# Patient Record
Sex: Female | Born: 1965 | Race: White | Hispanic: No | Marital: Married | Smoking: Never smoker
Health system: Southern US, Community
[De-identification: ages and names within clinical notes are randomized; demographics above are authoritative.]

## PROBLEM LIST (undated history)

## (undated) DIAGNOSIS — C9210 Chronic myeloid leukemia, BCR/ABL-positive, not having achieved remission: Secondary | ICD-10-CM

## (undated) DIAGNOSIS — N83209 Unspecified ovarian cyst, unspecified side: Secondary | ICD-10-CM

## (undated) HISTORY — DX: Chronic myeloid leukemia, BCR/ABL-positive, not having achieved remission: C92.10

## (undated) HISTORY — PX: BUNIONECTOMY: SHX129

---

## 2000-09-05 ENCOUNTER — Other Ambulatory Visit: Admission: RE | Admit: 2000-09-05 | Discharge: 2000-09-05 | Payer: Self-pay | Admitting: *Deleted

## 2001-01-11 ENCOUNTER — Inpatient Hospital Stay (HOSPITAL_COMMUNITY): Admission: RE | Admit: 2001-01-11 | Discharge: 2001-01-11 | Payer: Self-pay | Admitting: Obstetrics & Gynecology

## 2001-02-28 ENCOUNTER — Encounter: Payer: Self-pay | Admitting: Gynecology

## 2001-02-28 ENCOUNTER — Ambulatory Visit (HOSPITAL_COMMUNITY): Admission: RE | Admit: 2001-02-28 | Discharge: 2001-02-28 | Payer: Self-pay | Admitting: Gynecology

## 2001-03-27 ENCOUNTER — Ambulatory Visit (HOSPITAL_COMMUNITY): Admission: RE | Admit: 2001-03-27 | Discharge: 2001-03-27 | Payer: Self-pay | Admitting: Gynecology

## 2001-03-27 ENCOUNTER — Encounter: Payer: Self-pay | Admitting: Gynecology

## 2001-11-05 HISTORY — PX: LAPAROSCOPIC ENDOMETRIOSIS FULGURATION: SUR769

## 2002-06-05 ENCOUNTER — Encounter (INDEPENDENT_AMBULATORY_CARE_PROVIDER_SITE_OTHER): Payer: Self-pay | Admitting: *Deleted

## 2002-06-05 ENCOUNTER — Ambulatory Visit (HOSPITAL_COMMUNITY): Admission: RE | Admit: 2002-06-05 | Discharge: 2002-06-05 | Payer: Self-pay | Admitting: Gynecology

## 2003-09-23 ENCOUNTER — Other Ambulatory Visit: Admission: RE | Admit: 2003-09-23 | Discharge: 2003-09-23 | Payer: Self-pay | Admitting: Gynecology

## 2003-11-10 ENCOUNTER — Encounter: Admission: RE | Admit: 2003-11-10 | Discharge: 2003-11-10 | Payer: Self-pay | Admitting: Gynecology

## 2010-01-18 ENCOUNTER — Other Ambulatory Visit: Admission: RE | Admit: 2010-01-18 | Discharge: 2010-01-18 | Payer: Self-pay | Admitting: Family Medicine

## 2010-01-18 ENCOUNTER — Encounter: Admission: RE | Admit: 2010-01-18 | Discharge: 2010-01-18 | Payer: Self-pay | Admitting: Family Medicine

## 2011-03-23 NOTE — Op Note (Signed)
NAME:  Christina Kelly, Christina Kelly                       ACCOUNT NO.:  [id number]   MEDICAL RECORD NO.:  [id number]                   PATIENT TYPE:  AMB   LOCATION:  SDC                                  FACILITY:  [facility]   PHYSICIAN:  Ivor Costa. Farrel Gobble, M.D.              DATE OF BIRTH:  11/03/1966   DATE OF PROCEDURE:  06/05/2002  DATE OF DISCHARGE:                                 OPERATIVE REPORT   PREOPERATIVE DIAGNOSIS:  Severe dysmenorrhea.   POSTOPERATIVE DIAGNOSIS:  1. Severe dysmenorrhea.  2. Endometriosis.   PROCEDURE:  Laparoscopic fulguration of endometriosis with the YAG and  electrocautery and sharp excision of endometriosis.   SURGEON:  Ivor Costa. Farrel Gobble, M.D.   ANESTHESIA:  General.   ESTIMATED BLOOD LOSS:  Minimal.   FINDINGS:  The left ovary was noted to have multiple areas of serosal  studding of endometriosis.  There was also what appeared to be black and  white lesions on the right sidewall, right uterosacral ligament, and  anterior cul-de-sac.  The right adnexa was remarkable only for a functional  cyst.  Pathology was of peritoneum from the left sidewall..   DESCRIPTION OF PROCEDURE:  The patient was taken to the operating room, and  general anesthesia was induced.  Placed in dorsal lithotomy position and  prepped and draped in the usual sterile fashion.  Bivalve speculum was  placed in the vagina, and the cervix was visualized and Hulka manipulator  was placed.  Attention was then turned to the abdomen.  An infraumbilical  incision was made with a scalpel through which the Veress needle was  inserted.  Opening pressure was 2, and pneumoperitoneum was created.  __________ appreciated of the liver.  The #10-11 disposable trocar was then  inserted through the infraumbilical port.  Placement in the abdominal cavity  was confirmed.  The uterus was elevated, and the findings were noted.  The  __________ on torso, we had to make a 5 mm strip for pubic port which we did  under direct visualization in order to have better access to the pelvis.  Because of multiple areas of studding of endometriosis, we decided to use  the YAG laser #4 round tip at 10 watts were transmitted in infraumbilical  port, and the uterosacral ligaments and left ovary were spot treated.  Also,  a small area on the anterior cul-de-sac was spot treated.  However, prior to  treating the left sidewall, we wanted to see peristalsis of the left ureter.  We had seen peristalsis of the right.  However, despite multiple attempts at  promoting peristalsis, none was able to be seen.  Therefore, did not feel  comfortable lasering the multiple sites on the left sidewall.  Instead, the  YAG was removed.  The suction irrigator was placed in a __________  spontaneously __________ in the ovary, and the retroperitoneal space was  hydrodissected.  The peritoneum was hydrodissected off the  retroperitoneum  from the retroperitoneal space.  The peritoneum was then elevated, and the  areas of concern were sharply excised off.  Hemostasis was noted, but there  was another area of endometriosis on the ovary that was seen, and this was  treated with cautery with the Kleppinger off another area of endometriosis  seen and peritoneum that had not been incorporated into the biopsy was  treated with cautery after the underlying structures were bluntly dissected  off.  The pelvis was irrigated with copious amounts of warm saline.  Inspection of the biopsied areas were  hemostatic.  The instruments were then removed under direct visualization.  The infraumbilical port was closed with figure-of-eight of 0 Vicryl .  Subcu  was closed with plain.  All three ports were injected with 0.25% Marcaine  for a total of 10 cc.  The instruments were removed from the vagina, and the  cervix was noted to be hemostatic.                                               Ivor Costa. Farrel Gobble, M.D.    THL/MEDQ  D:  06/05/2002  T:   06/10/2002  Job:  [id number]

## 2011-03-23 NOTE — Op Note (Signed)
NAME:  Christina Kelly, VANALSTINE NO.:  [id number]   MEDICAL RECORD NO.:  [id number]                   PATIENT TYPE:   LOCATION:                                       FACILITY:   PHYSICIAN:  Ivor Costa. Farrel Gobble, M.D.              DATE OF BIRTH:   DATE OF PROCEDURE:  DATE OF DISCHARGE:                                 OPERATIVE REPORT   CHIEF COMPLAINT:  Severe dysmenorrhea.   HISTORY OF PRESENT ILLNESS:  The patient is a 45 year old, G0 who reports  history of  regular menstrual cycles who states that she has severe  dysmenorrhea.  The patient usually takes 3 Anaprox DS multiple times a day  during the first several days of her cycle which minimizes the pain but does  not completely eliminate it.  She as unaware initially that the  pain was  out of proportion to that of the average person.  The patient also reported  later on that there indeed was a family history of endometriosis having a  diagnosis in her sister and also being found in a maternal aunt.  Her sister  is currently scheduled to undergo a hysterectomy.  The patient has a  negative OB/GYN history.  Her cycles are regular with five to seven days of  flow.  She has never obtained a pregnancy, no pain with penetration.  No  history of sexually transmitted diseases.   PAST MEDICAL HISTORY:  Negative.   PAST SURGICAL HISTORY:  Significant only for multiple bunion surgeries in  1998, 2000, and 2001.   MEDICATIONS:  The patient is on several Progonal cycles; however she is not  currently taking any medications.   ALLERGIES:  None.   SOCIAL HISTORY:  Social alcohol.  She exercises on a regular basis.   PHYSICAL EXAMINATION:  GENERAL:  She is a well-appearing female in no acute  distress.  HEART:  Regular.  LUNGS:  Clear to auscultation.  BREASTS:  Without mass, discharge, retractions.  Nipple is in correct  position.  ABDOMEN:  Soft,  nontender, without rebound or guarding.  PELVIC:  Normal  external female genitalia.  The BUS is negative.  The vagina  is pink and moist.  The cervix is without lesions.  The uterus is axial,  mobile, and nontender.  The adnexa are without tenderness.  RECTOVAGINAL:  No nodularity of the uterosacral ligaments was appreciated.  There was also no tenderness.   ASSESSMENT:  1. History of severe dysmenorrhea.  2. Multiple attempts at pregnancy with deep ovulation which have failed.  3. Family history of endometriosis.   The patient will present to the hospital for laparoscopy and possible  fulguration of endometriosis.  All questions were addressed.  Ivor Costa. Farrel Gobble, M.D.    THL/MEDQ  D:  06/04/2002  T:  06/05/2002  Job:  [id number]

## 2012-02-21 ENCOUNTER — Other Ambulatory Visit: Payer: Self-pay | Admitting: Family Medicine

## 2012-02-21 DIAGNOSIS — Z1231 Encounter for screening mammogram for malignant neoplasm of breast: Secondary | ICD-10-CM

## 2012-03-19 ENCOUNTER — Ambulatory Visit
Admission: RE | Admit: 2012-03-19 | Discharge: 2012-03-19 | Disposition: A | Discharge status: Home / Self Care | Payer: BC Managed Care – PPO | Source: Ambulatory Visit | Attending: Family Medicine | Admitting: Family Medicine

## 2012-03-19 DIAGNOSIS — Z1231 Encounter for screening mammogram for malignant neoplasm of breast: Secondary | ICD-10-CM

## 2012-03-21 ENCOUNTER — Other Ambulatory Visit: Payer: Self-pay | Admitting: Family Medicine

## 2012-03-21 DIAGNOSIS — R928 Other abnormal and inconclusive findings on diagnostic imaging of breast: Secondary | ICD-10-CM

## 2012-03-28 ENCOUNTER — Ambulatory Visit
Admission: RE | Admit: 2012-03-28 | Discharge: 2012-03-28 | Disposition: A | Discharge status: Home / Self Care | Payer: BC Managed Care – PPO | Source: Ambulatory Visit | Attending: Family Medicine | Admitting: Family Medicine

## 2012-03-28 ENCOUNTER — Other Ambulatory Visit: Payer: Self-pay | Admitting: Family Medicine

## 2012-03-28 DIAGNOSIS — R928 Other abnormal and inconclusive findings on diagnostic imaging of breast: Secondary | ICD-10-CM

## 2013-02-26 ENCOUNTER — Other Ambulatory Visit: Payer: Self-pay | Admitting: Physician Assistant

## 2013-02-26 ENCOUNTER — Other Ambulatory Visit (HOSPITAL_COMMUNITY)
Admission: RE | Admit: 2013-02-26 | Discharge: 2013-02-26 | Disposition: A | Discharge status: Home / Self Care | Payer: BC Managed Care – PPO | Source: Ambulatory Visit | Attending: Family Medicine | Admitting: Family Medicine

## 2013-02-26 DIAGNOSIS — Z124 Encounter for screening for malignant neoplasm of cervix: Secondary | ICD-10-CM | POA: Insufficient documentation

## 2013-02-27 ENCOUNTER — Ambulatory Visit (HOSPITAL_BASED_OUTPATIENT_CLINIC_OR_DEPARTMENT_OTHER): Payer: BC Managed Care – PPO | Admitting: Hematology & Oncology

## 2013-02-27 ENCOUNTER — Other Ambulatory Visit (HOSPITAL_BASED_OUTPATIENT_CLINIC_OR_DEPARTMENT_OTHER): Payer: BC Managed Care – PPO | Admitting: Lab

## 2013-02-27 ENCOUNTER — Ambulatory Visit: Payer: BC Managed Care – PPO

## 2013-02-27 ENCOUNTER — Telehealth: Payer: Self-pay | Admitting: Hematology & Oncology

## 2013-02-27 VITALS — BP 117/62 | HR 89 | Temp 98.7°F | Resp 16 | Ht 71.0 in | Wt 157.0 lb

## 2013-02-27 DIAGNOSIS — D473 Essential (hemorrhagic) thrombocythemia: Secondary | ICD-10-CM

## 2013-02-27 DIAGNOSIS — D72829 Elevated white blood cell count, unspecified: Secondary | ICD-10-CM

## 2013-02-27 LAB — CBC WITH DIFFERENTIAL (CANCER CENTER ONLY)
BASO#: 1 10*3/uL — ABNORMAL HIGH (ref 0.0–0.2)
Eosinophils Absolute: 0.7 10*3/uL — ABNORMAL HIGH (ref 0.0–0.5)
HGB: 12.3 g/dL (ref 11.6–15.9)
LYMPH%: 14.9 % (ref 14.0–48.0)
MCH: 30.1 pg (ref 26.0–34.0)
MCV: 93 fL (ref 81–101)
MONO#: 1 10*3/uL — ABNORMAL HIGH (ref 0.1–0.9)
MONO%: 3.3 % (ref 0.0–13.0)
Platelets: 793 10*3/uL — ABNORMAL HIGH (ref 145–400)
RBC: 4.08 10*6/uL (ref 3.70–5.32)
WBC: 31.9 10*3/uL — ABNORMAL HIGH (ref 3.9–10.0)

## 2013-02-27 LAB — COMPREHENSIVE METABOLIC PANEL
ALT: 14 U/L (ref 0–35)
AST: 21 U/L (ref 0–37)
Albumin: 4.6 g/dL (ref 3.5–5.2)
CO2: 28 mEq/L (ref 19–32)
Calcium: 9.9 mg/dL (ref 8.4–10.5)
Chloride: 104 mEq/L (ref 96–112)
Creatinine, Ser: 1.09 mg/dL (ref 0.50–1.10)
Potassium: 4.1 mEq/L (ref 3.5–5.3)

## 2013-02-27 LAB — TECHNOLOGIST REVIEW CHCC SATELLITE: Tech Review: 4

## 2013-02-27 LAB — CHCC SATELLITE - SMEAR

## 2013-02-27 LAB — LACTATE DEHYDROGENASE: LDH: 348 U/L — ABNORMAL HIGH (ref 94–250)

## 2013-02-27 MED ORDER — MEPERIDINE HCL 25 MG/ML IJ SOLN: 50.0000 mg | Freq: Once | INTRAMUSCULAR | Status: DC

## 2013-02-27 MED ORDER — MIDAZOLAM HCL 10 MG/2ML IJ SOLN: 10.0000 mg | Freq: Once | INTRAMUSCULAR | Status: DC

## 2013-02-27 NOTE — Progress Notes (Signed)
This office note has been dictated.

## 2013-02-27 NOTE — Telephone Encounter (Signed)
Per Md order to sch patient Bone marrow apt.  MD sch BM apt with Darl Pikes for 03/10/13 and spoke with Montez Morita also.  Patient is aware of apt date and time.  Amy gave patient BM form to take with her to her apt.

## 2013-02-28 NOTE — Progress Notes (Signed)
CC:   Christina Kelly, M.D.  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  HISTORY OF PRESENT ILLNESS:  Christina Kelly is a very nice 47 year old white female.  She is initially from [place].  She has been very healthy.  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  She is seen by Dr. Laurann Montana.  She had been having an abnormal monthly cycle for 3 weeks.  She was given a thorough evaluation.  So far, nothing was unremarkable until a CBC was done.  Shockingly enough, she had an elevated white cell count of 30.7.  Her platelet count also elevated at 376,000.  Hemoglobin was 12.2 and hematocrit 37.3.  It was noted that she did have some "abnormal white blood cells" on the blood smear.  Dr. Lucilla Lame office kindly called the [facility], and we were able to get her in today.  Again, she has been feeling well.  She has a 83-year-old son, and she has been able to keep up with him.  He has not noted any problems with weight loss.  There are no sweats or fevers.  She has had no abdominal pain.  There have been no rashes.  She has had no swollen lymph glands. She has not noticed any change in bowel or bladder habits.  Overall, her performance status is ECOG 0.  PAST MEDICAL HISTORY:  Remarkable for: 1. Intermittent herpetic cold sores. 2. Anxiety. 3. Endometriosis. 4. Allergic rhinitis.  ALLERGIES:  The Floxin category of antibiotics.  MEDICATIONS: 1. Valtrex 1 g p.o. b.i.d. as needed. 2. Xanax 0.25 mg p.o. t.i.d. p.r.n. 3. Allegra 180 mg p.o. daily p.r.n.  SOCIAL HISTORY:  Negative for tobacco use.  She has social alcohol use. She has no occupational exposures.  Again, she is very athletic.  She used to play college basketball and volleyball.  FAMILY HISTORY:  Noncontributory.  There is no obvious blood issues in the family.  Family history is positive for father who passed away a month ago from lung  cancer.  Her mother passed away a couple of years ago from COPD.  REVIEW OF SYSTEMS:  As stated in the history of present illness.  No additional findings noted on a 12-system review.  PHYSICAL EXAMINATION:  General:  This is a tall, well-nourished and athletic-appearing white female in no obvious distress.  Vital signs: Temperature 98.7, pulse 89, respiratory rate 16, blood pressure 117/62. Weight is 157.  Head and neck:  Normocephalic, atraumatic skull.  There are no ocular or oral lesions.  There are no palpable cervical or supraclavicular lymph nodes.  Lungs:  Clear bilaterally.  Cardiac: Regular rate and rhythm with a normal S1 and S2.  There are no murmurs, rubs, or bruits.  Abdomen:  Soft with good bowel sounds.  There is no palpable abdominal mass.  There is no fluid wave.  There is no guarding or rebound tenderness.  There is no palpable hepatosplenomegaly.  Back: No tenderness over the spine, ribs, or hips.  Extremities:  Show no clubbing, cyanosis or edema.  She has good range motion of her joints. She has good pulses in her distal extremities.  Neurological:  Shows no focal neurological deficits.  Skin:  No rashes, ecchymosis, or petechia.  LABORATORY STUDIES:  White cell count is 31.7, hemoglobin is 12.3, hematocrit 38.1, platelet count 733.  MCV is 93.  Her peripheral blood smear shows normochromic, normocytic population of red blood cells.  I see no nucleated red blood cells.  There are no teardrop cells.  There is no rouleaux formation.  There are no target cells.  White cells are markedly increased in number.  She has immature white cells.  There are some hypersegmented polys.  She had some metamyelocytes and myelocytes.  There may be a couple of promyelocytes. I do not see any blasts.  Lymphocytes appear mature.  Platelets are increased in number.  She has several large platelets.  IMPRESSION:  Christina Kelly is a very nice 47 year old white female with leukocytosis  and thrombocytosis.  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  The blood smear is very consistent with this.  I would be surprised if this is anything else but chronic myeloid leukemia.  I suppose that she may have a chronic myeloproliferative neoplasm which is a nonspecific type of diagnosis.  I do not believe that we have acute leukemia, as a do not see any blasts on the peripheral blood smear.  We are going to have to do a bone marrow test on her.  I talked to her and her wife regarding this.  We are going to set this up for May 6th at [facility].  I did send her peripheral blood for BCR/ABL gene rearrangement.  I suspect that this will be positive.  I spent a good hour and a half with Christina Kelly.  She came in with her partner.  We had a very nice time.  We talked about a lot of different things.  I explained to her what I thought the diagnosis was.  I explained to her the potential treatments that we have if she does have CML.  She understands very well.  Christina Kelly is very eloquent.  She should do very well.  I am very confident that we can get this in remission and keep her quality of life and functional status excellent.    ______________________________ Josph Macho, M.D. PRE/MEDQ  D:  02/27/2013  T:  02/28/2013  Job:  [id number]

## 2013-03-02 ENCOUNTER — Other Ambulatory Visit: Payer: Self-pay

## 2013-03-02 DIAGNOSIS — Z1231 Encounter for screening mammogram for malignant neoplasm of breast: Secondary | ICD-10-CM

## 2013-03-03 ENCOUNTER — Encounter (HOSPITAL_COMMUNITY): Payer: Self-pay | Admitting: Pharmacy Technician

## 2013-03-05 HISTORY — PX: BONE MARROW ASPIRATE AND BIOPSY WIITH LUMBAR PUNCTURE: SHX1250

## 2013-03-09 ENCOUNTER — Other Ambulatory Visit: Payer: Self-pay | Admitting: Hematology & Oncology

## 2013-03-09 ENCOUNTER — Other Ambulatory Visit: Payer: Self-pay | Admitting: *Deleted

## 2013-03-09 DIAGNOSIS — D473 Essential (hemorrhagic) thrombocythemia: Secondary | ICD-10-CM

## 2013-03-09 DIAGNOSIS — C9210 Chronic myeloid leukemia, BCR/ABL-positive, not having achieved remission: Secondary | ICD-10-CM

## 2013-03-09 DIAGNOSIS — D72829 Elevated white blood cell count, unspecified: Secondary | ICD-10-CM

## 2013-03-09 NOTE — Progress Notes (Signed)
Orders entered for BMBX on 03/10/13 (IVF, & consent). Dr Myna Hidalgo will enter sedation orders.

## 2013-03-10 ENCOUNTER — Encounter (HOSPITAL_COMMUNITY): Payer: Self-pay

## 2013-03-10 ENCOUNTER — Ambulatory Visit (HOSPITAL_BASED_OUTPATIENT_CLINIC_OR_DEPARTMENT_OTHER): Payer: BC Managed Care – PPO | Admitting: Hematology & Oncology

## 2013-03-10 ENCOUNTER — Ambulatory Visit (HOSPITAL_COMMUNITY)
Admission: RE | Admit: 2013-03-10 | Discharge: 2013-03-10 | Disposition: A | Discharge status: Home / Self Care | Payer: BC Managed Care – PPO | Source: Ambulatory Visit | Attending: Hematology & Oncology | Admitting: Hematology & Oncology

## 2013-03-10 VITALS — BP 110/72 | HR 66 | Temp 98.1°F | Resp 16

## 2013-03-10 DIAGNOSIS — D72829 Elevated white blood cell count, unspecified: Secondary | ICD-10-CM

## 2013-03-10 DIAGNOSIS — C9210 Chronic myeloid leukemia, BCR/ABL-positive, not having achieved remission: Secondary | ICD-10-CM

## 2013-03-10 DIAGNOSIS — D473 Essential (hemorrhagic) thrombocythemia: Secondary | ICD-10-CM | POA: Insufficient documentation

## 2013-03-10 DIAGNOSIS — D75839 Thrombocytosis, unspecified: Secondary | ICD-10-CM

## 2013-03-10 DIAGNOSIS — D47Z9 Other specified neoplasms of uncertain behavior of lymphoid, hematopoietic and related tissue: Secondary | ICD-10-CM | POA: Insufficient documentation

## 2013-03-10 DIAGNOSIS — D649 Anemia, unspecified: Secondary | ICD-10-CM | POA: Insufficient documentation

## 2013-03-10 HISTORY — DX: Unspecified ovarian cyst, unspecified side: N83.209

## 2013-03-10 LAB — CBC
Platelets: 718 10*3/uL — ABNORMAL HIGH (ref 150–400)
RBC: 3.68 MIL/uL — ABNORMAL LOW (ref 3.87–5.11)
WBC: 35.8 10*3/uL — ABNORMAL HIGH (ref 4.0–10.5)

## 2013-03-10 MED ORDER — MEPERIDINE HCL 50 MG/ML IJ SOLN
50.0000 mg | Freq: Once | INTRAMUSCULAR | Status: DC
  Filled 2013-03-10: qty 1

## 2013-03-10 MED ORDER — MIDAZOLAM HCL 10 MG/2ML IJ SOLN
10.0000 mg | Freq: Once | INTRAMUSCULAR | Status: DC
  Filled 2013-03-10: qty 2

## 2013-03-10 MED ORDER — MIDAZOLAM HCL 5 MG/5ML IJ SOLN
INTRAMUSCULAR | Status: AC | PRN
  Administered 2013-03-10: 1 mg via INTRAVENOUS
  Administered 2013-03-10: 2 mg via INTRAVENOUS
  Administered 2013-03-10 (×3): 1 mg via INTRAVENOUS

## 2013-03-10 MED ORDER — SODIUM CHLORIDE 0.9 % IV SOLN
INTRAVENOUS | Status: DC
  Administered 2013-03-10: 08:00:00 via INTRAVENOUS

## 2013-03-10 MED ORDER — MEPERIDINE HCL 25 MG/ML IJ SOLN
INTRAMUSCULAR | Status: AC | PRN
  Administered 2013-03-10: 25 mg via INTRAVENOUS
  Administered 2013-03-10: 12.5 mg via INTRAVENOUS

## 2013-03-10 NOTE — ED Notes (Signed)
Bx site dressing clean, dry and intact

## 2013-03-10 NOTE — Progress Notes (Signed)
This office note has been dictated.

## 2013-03-11 NOTE — Procedures (Signed)
[name] was brought to the short stay unit at [facility].  She had a bone marrow biopsy and aspirate because of a new diagnosis of chronic myeloid leukemia.  Her Mallampati score was 1.  ASA class was 1.  We did the appropriate time-out procedure.  She had an IV placed peripherally.  She was then placed onto her right side.  She received a total of 37.5 mg of Demerol and 6 mg of Versed for IV sedation.  The left posterior iliac crest region was prepped and draped in sterile fashion.  10 cc of 2% lidocaine was infiltrated under the skin down to the periosteum.  A #11 scalpel was used to make an incision into the skin.  Two bone marrow aspirates were obtained without any difficulty.  One aspirate was sent for flow cytometry, cytogenetics, and FISH studies.  We then obtained an excellent bone marrow biopsy core.  The patient tolerated the procedure well.  We dressed the procedure site sterilely.    ______________________________ Josph Macho, M.D. PRE/MEDQ  D:  03/10/2013  T:  03/11/2013  Job:  [id number]

## 2013-03-13 ENCOUNTER — Other Ambulatory Visit: Payer: Self-pay | Admitting: Hematology & Oncology

## 2013-03-13 ENCOUNTER — Telehealth: Payer: Self-pay | Admitting: Hematology & Oncology

## 2013-03-13 NOTE — Telephone Encounter (Signed)
Pt aware of 5-14

## 2013-03-18 ENCOUNTER — Encounter: Payer: Self-pay | Admitting: Hematology & Oncology

## 2013-03-18 ENCOUNTER — Ambulatory Visit (HOSPITAL_BASED_OUTPATIENT_CLINIC_OR_DEPARTMENT_OTHER): Payer: BC Managed Care – PPO | Admitting: Hematology & Oncology

## 2013-03-18 VITALS — BP 97/64 | HR 78 | Temp 98.2°F | Resp 16 | Ht 71.0 in | Wt 159.0 lb

## 2013-03-18 DIAGNOSIS — C9210 Chronic myeloid leukemia, BCR/ABL-positive, not having achieved remission: Secondary | ICD-10-CM

## 2013-03-18 HISTORY — DX: Chronic myeloid leukemia, BCR/ABL-positive, not having achieved remission: C92.10

## 2013-03-19 NOTE — Progress Notes (Signed)
CC:   Christina Kelly. Christina Kelly, M.D.  DIAGNOSIS:  Chronic-phase CML.  CURRENT THERAPY:  Patient to start Sprycel 70 mg p.o. daily.  INTERIM HISTORY:  Christina Kelly comes in for her second office visit. When we first saw her, she had a white cell count of 32,000.  Her blood smear was highly consistent with a chronic myeloid leukemia.  We did go ahead and do a bone marrow biopsy and aspirate on her.  This was done on May 6th.  The pathology report ([id number]) showed a hypercellular marrow consistent with chronic myeloid leukemia.  There was some eosinophilia and basophilia on her peripheral smear.  On her bone marrow biopsy and aspirate, she only had, I think, 2% blasts.  We did send off the BCR-ABL gene rearrangement study.  Not surprisingly, she was quite positive.  She had a BCR-ABL ratio of 153%.  She is feeling well.  She did have a thrombocytosis of about 800,000 platelets.  She has had no problems with fatigue or weakness.  She has had no nausea or vomiting.  There has been no headache.  There has been no change in bowel or bladder habits.  There have been no rashes.  PHYSICAL EXAMINATION:  General:  This is a well-developed, well- nourished white female in no obvious distress.  Vital signs: Temperature of 98.2, pulse 78, respiratory rate 16, blood pressure 97/64.  Weight is 159.  Head and neck:  No ocular or oral lesions. There are no palpable cervical or supraclavicular lymph nodes.  Lungs: Clear bilaterally.  Cardiac:  Regular rate and rhythm with a normal S1 and S2.  There are no murmurs, rubs or bruits.  Abdomen:  Soft with good bowel sounds.  There is no palpable abdominal mass.  There is no palpable hepatosplenomegaly.  Extremities:  No clubbing, cyanosis or edema.  Neurological:  No focal neurological deficits.  Skin:  No rashes, ecchymosis, or petechia.  IMPRESSION:  Christina Kelly is a very nice 47 year old white female with a new diagnosis of chronic-phase CML.  This is  confirmed by the BCR-ABL rearrangement study.  Her bone marrow is also consistent with this.  I think Sprycel would be a good idea for her __________ she is young. We want to try to get her into a quick remission that is a major molecular remission.  This clearly has shown to have a survival advantage in studies that have been done.  Of note, we did do an EKG on her today.  Everything looked fine.  She did not have any QT prolongation.  When we last saw her, all of her lab work looked okay.  There were no issues with hypokalemia.  Again, I do not see a problem with her being on Sprycel.  I gave her some information sheets about Sprycel.  We will work on getting the Sprycel for her.  I will want to see her back in 1 month.  I would not check her BCR/ABL rearrangement assay probably for 3 months. At that point in time, we should hopefully see her levels down below 1%.  I spent about 45 minutes with Christina Kelly today.  Again, I answered all of her questions.  I went over the side effects with Christina Kelly regarding the Sprycel. I told her that we need to watch out for fluid retention.  There is a risk of pleural effusions.  I told her to call us if she feels any shortness of breath or chest congestion.  Again, I do  not see her taking any kind of medications that would interfere with Sprycel.    ______________________________ Josph Macho, M.D. PRE/MEDQ  D:  03/18/2013  T:  03/19/2013  Job:  [id number]

## 2013-03-20 ENCOUNTER — Ambulatory Visit
Admission: RE | Admit: 2013-03-20 | Discharge: 2013-03-20 | Disposition: A | Discharge status: Home / Self Care | Payer: BC Managed Care – PPO | Source: Ambulatory Visit

## 2013-03-20 ENCOUNTER — Other Ambulatory Visit: Payer: Self-pay | Admitting: *Deleted

## 2013-03-20 DIAGNOSIS — C9210 Chronic myeloid leukemia, BCR/ABL-positive, not having achieved remission: Secondary | ICD-10-CM

## 2013-03-20 DIAGNOSIS — Z1231 Encounter for screening mammogram for malignant neoplasm of breast: Secondary | ICD-10-CM

## 2013-03-20 LAB — CHROMOSOME ANALYSIS, BONE MARROW

## 2013-03-20 MED ORDER — DASATINIB 70 MG PO TABS: 70.0000 mg | ORAL_TABLET | Freq: Every day | ORAL | Status: DC

## 2013-03-20 NOTE — Telephone Encounter (Signed)
Pt to start Sprycel 70 mg PO daily. Rx sent via e-rx to [facility] per pt's request.

## 2013-03-24 ENCOUNTER — Telehealth: Payer: Self-pay | Admitting: Hematology & Oncology

## 2013-03-24 NOTE — Telephone Encounter (Signed)
[facility] has APPROVED coverage for [facility] tablet under her plan effective 02/21/2013 - 03/23/2014.

## 2013-04-03 ENCOUNTER — Ambulatory Visit: Payer: BC Managed Care – PPO

## 2013-04-15 ENCOUNTER — Telehealth: Payer: Self-pay | Admitting: Hematology & Oncology

## 2013-04-15 ENCOUNTER — Encounter: Payer: Self-pay | Admitting: Hematology & Oncology

## 2013-04-15 ENCOUNTER — Ambulatory Visit (HOSPITAL_BASED_OUTPATIENT_CLINIC_OR_DEPARTMENT_OTHER): Payer: BC Managed Care – PPO | Admitting: Hematology & Oncology

## 2013-04-15 ENCOUNTER — Other Ambulatory Visit: Payer: Self-pay | Admitting: *Deleted

## 2013-04-15 ENCOUNTER — Other Ambulatory Visit (HOSPITAL_BASED_OUTPATIENT_CLINIC_OR_DEPARTMENT_OTHER): Payer: BC Managed Care – PPO | Admitting: Lab

## 2013-04-15 VITALS — BP 96/58 | HR 60 | Temp 98.3°F | Resp 16 | Ht 73.0 in | Wt 163.0 lb

## 2013-04-15 DIAGNOSIS — C9210 Chronic myeloid leukemia, BCR/ABL-positive, not having achieved remission: Secondary | ICD-10-CM

## 2013-04-15 LAB — BASIC METABOLIC PANEL - CANCER CENTER ONLY
Chloride: 102 mEq/L (ref 98–108)
Creat: 0.7 mg/dl (ref 0.6–1.2)
Potassium: 5.3 mEq/L — ABNORMAL HIGH (ref 3.3–4.7)

## 2013-04-15 LAB — CBC WITH DIFFERENTIAL (CANCER CENTER ONLY)
Eosinophils Absolute: 0.2 10*3/uL (ref 0.0–0.5)
LYMPH#: 2 10*3/uL (ref 0.9–3.3)
MONO#: 0.4 10*3/uL (ref 0.1–0.9)
NEUT#: 4.3 10*3/uL (ref 1.5–6.5)
Platelets: 295 10*3/uL (ref 145–400)
RBC: 3.43 10*6/uL — ABNORMAL LOW (ref 3.70–5.32)
WBC: 7 10*3/uL (ref 3.9–10.0)

## 2013-04-15 LAB — MAGNESIUM: Magnesium: 2.3 mg/dL (ref 1.5–2.5)

## 2013-04-15 LAB — CHCC SATELLITE - SMEAR

## 2013-04-15 MED ORDER — DASATINIB 70 MG PO TABS: 70.0000 mg | ORAL_TABLET | Freq: Every day | ORAL | Status: DC

## 2013-04-15 NOTE — Telephone Encounter (Signed)
Asked about a Sprycel refill while here for an appt. She has no additional refills. Sent refills to [facility].

## 2013-04-15 NOTE — Telephone Encounter (Signed)
Pt will call to schedule 6 week follow up her schedule is busy around that time.

## 2013-04-15 NOTE — Progress Notes (Signed)
This office note has been dictated.

## 2013-04-16 NOTE — Progress Notes (Signed)
CC:   [name]. Cliffton Asters, M.D.  DIAGNOSIS:  Chronic phase CML.  CURRENT THERAPY:  Sprycel 70 mg p.o. daily.  INTERIM HISTORY:  Ms. Bagot comes in for followup.  She is now on Sprycel. She has been on it for probably about 3 weeks or so.  She has tolerated it well.  She said that the first week, she had vertigo.  This did improve and now she does not have any.  She has had no issues with leg swelling.  There has been no rashes.  There has been no diarrhea.  She has had no headache.  There has been no cough or shortness of breath.  Overall, her performance status is ECOG 0.  PHYSICAL EXAMINATION:  General:  This is a well-developed, well- nourished white female in no obvious distress.  Vital signs: Temperature of 98.3, pulse 60, respiratory rate 16, blood pressure 96/58.  Weight is 163.  Head and neck:  Normocephalic, atraumatic skull. There are no ocular or oral lesions.  There are no palpable cervical or supraclavicular lymph nodes.  Lungs:  Clear bilaterally.  Cardiac: Regular rate and rhythm with a normal S1 and S2.  There are no murmurs, rubs, or bruits.  Abdomen:  Soft with good bowel sounds.  There is no palpable abdominal mass.  There is no fluid wave.  There is no palpable hepatosplenomegaly.  Extremities:  Show no clubbing, cyanosis or edema. Skin:  Shows no rashes, ecchymosis or petechia.  Neurological:  No focal neurological deficits.  LABORATORY STUDIES:  White cell count is 7, hemoglobin 10.6, hematocrit 32.6, platelet count is 295.  Peripheral smear shows good maturation of her white blood cells.  I do not see any immature myeloid cells.  There may be a couple of hypersegmented polys.  I see no atypical lymphocytes.  There are no blasts.  IMPRESSION:  Ms. Bonneau is a very charming 47 year old white female with chronic phase chronic myeloid leukemia.  We have her on Sprycel. Hematologically, she is in remission now.  Her white cells are normalized.  Platelet  count is normalized.  Blood smear does not show immature myeloid series.  I see no complications from the Sprycel as of yet.  She had labs.  All of her labs look good. Potassium is up slightly.  I think this might be a pseudo hyperkalemia from her past thrombocytosis.  I do not see any evidence of any kind of liver issues right now.  I will go ahead and plan to see her back in 6 weeks.  I will not check another BCR-ABL assay on her probably until September.    ______________________________ Josph Macho, M.D. PRE/MEDQ  D:  04/15/2013  T:  04/16/2013  Job:  [id number]

## 2013-04-29 ENCOUNTER — Other Ambulatory Visit: Payer: Self-pay | Admitting: *Deleted

## 2013-04-29 DIAGNOSIS — R609 Edema, unspecified: Secondary | ICD-10-CM

## 2013-04-29 DIAGNOSIS — C9210 Chronic myeloid leukemia, BCR/ABL-positive, not having achieved remission: Secondary | ICD-10-CM

## 2013-04-29 MED ORDER — TRIAMTERENE-HCTZ 37.5-25 MG PO TABS: ORAL_TABLET | ORAL | Status: DC

## 2013-04-29 NOTE — Telephone Encounter (Signed)
Pt called stating she was told by Dr Myna Hidalgo that if she developed fluid retention to give him a call and he would order a diuretic. Reviewed with Dr Myna Hidalgo. To take Maxzide 37/25 po daily x 3 then prn. She did say she was having some pain in her calf but denies any warmth or significant difference in the size of her legs when compared with one another. Dr Myna Hidalgo made aware but to try the Maxzide for now and to call back if worsens. Pt verbalized understanding.

## 2013-05-04 ENCOUNTER — Other Ambulatory Visit: Payer: Self-pay | Admitting: *Deleted

## 2013-05-04 ENCOUNTER — Telehealth: Payer: Self-pay | Admitting: *Deleted

## 2013-05-04 ENCOUNTER — Telehealth: Payer: Self-pay | Admitting: Hematology & Oncology

## 2013-05-04 ENCOUNTER — Other Ambulatory Visit (HOSPITAL_BASED_OUTPATIENT_CLINIC_OR_DEPARTMENT_OTHER): Payer: BC Managed Care – PPO | Admitting: Lab

## 2013-05-04 DIAGNOSIS — C9210 Chronic myeloid leukemia, BCR/ABL-positive, not having achieved remission: Secondary | ICD-10-CM

## 2013-05-04 DIAGNOSIS — R238 Other skin changes: Secondary | ICD-10-CM

## 2013-05-04 LAB — CBC WITH DIFFERENTIAL (CANCER CENTER ONLY)
BASO#: 0 10*3/uL (ref 0.0–0.2)
Eosinophils Absolute: 0.1 10*3/uL (ref 0.0–0.5)
HGB: 10.6 g/dL — ABNORMAL LOW (ref 11.6–15.9)
MCH: 32 pg (ref 26.0–34.0)
MCV: 97 fL (ref 81–101)
MONO%: 5.9 % (ref 0.0–13.0)
NEUT#: 2.8 10*3/uL (ref 1.5–6.5)
RBC: 3.31 10*6/uL — ABNORMAL LOW (ref 3.70–5.32)

## 2013-05-04 NOTE — Telephone Encounter (Signed)
Pt called to report that she has mild, easy bruising. Reviewed with Dr Myna Hidalgo. To come in to have CBC checked today. Instructed pt to come in at 2pm.

## 2013-05-04 NOTE — Telephone Encounter (Signed)
New patient apt was sch for 06/08/13.  Patient was called and given apt date/time.  He is aware of location and length of apt.  He stated, he will be here.

## 2013-05-27 ENCOUNTER — Ambulatory Visit (HOSPITAL_BASED_OUTPATIENT_CLINIC_OR_DEPARTMENT_OTHER): Payer: BC Managed Care – PPO | Admitting: Hematology & Oncology

## 2013-05-27 ENCOUNTER — Other Ambulatory Visit (HOSPITAL_BASED_OUTPATIENT_CLINIC_OR_DEPARTMENT_OTHER): Payer: BC Managed Care – PPO | Admitting: Lab

## 2013-05-27 VITALS — BP 104/56 | HR 60 | Temp 98.4°F | Resp 16 | Ht 71.0 in | Wt 164.0 lb

## 2013-05-27 DIAGNOSIS — C9210 Chronic myeloid leukemia, BCR/ABL-positive, not having achieved remission: Secondary | ICD-10-CM

## 2013-05-27 LAB — COMPREHENSIVE METABOLIC PANEL
Alkaline Phosphatase: 42 U/L (ref 39–117)
BUN: 11 mg/dL (ref 6–23)
Creatinine, Ser: 1 mg/dL (ref 0.50–1.10)
Glucose, Bld: 92 mg/dL (ref 70–99)
Total Bilirubin: 0.3 mg/dL (ref 0.3–1.2)

## 2013-05-27 LAB — CBC WITH DIFFERENTIAL (CANCER CENTER ONLY)
BASO#: 0 10*3/uL (ref 0.0–0.2)
Eosinophils Absolute: 0.2 10*3/uL (ref 0.0–0.5)
HCT: 35.5 % (ref 34.8–46.6)
HGB: 11.4 g/dL — ABNORMAL LOW (ref 11.6–15.9)
LYMPH%: 44.3 % (ref 14.0–48.0)
MCH: 31.9 pg (ref 26.0–34.0)
MCV: 99 fL (ref 81–101)
MONO%: 9.1 % (ref 0.0–13.0)
NEUT%: 41.9 % (ref 39.6–80.0)
Platelets: 178 10*3/uL (ref 145–400)
RBC: 3.57 10*6/uL — ABNORMAL LOW (ref 3.70–5.32)

## 2013-05-27 LAB — MAGNESIUM: Magnesium: 2.2 mg/dL (ref 1.5–2.5)

## 2013-05-27 NOTE — Progress Notes (Signed)
This office note has been dictated.

## 2013-05-28 NOTE — Progress Notes (Signed)
CC:   [name]. Cliffton Asters, M.D.  DIAGNOSIS:  Chronic phase chronic myelogenous leukemia.  CURRENT THERAPY:  Sprycel 70 mg p.o. daily.  INTERIM HISTORY:  [name] comes in for followup.  She is doing quite well on the Sprycel.  She has been on Sprycel now probably about 6 weeks or so.  She did have a little bit of swelling initially.  We got her on some diuretic.  This has helped somewhat. She has had no diarrhea.  She has had a little bit of "acne."  There has been no cough or shortness breath.  She has had no nausea.  There has been no headache.  PHYSICAL EXAMINATION:  General:  This is a well-developed, well- nourished white female in no obvious distress.  Vital signs: Temperature of 98.4, pulse 60, respiratory rate 16, blood pressure 104/56.  Weight is 164.  Head and neck:  Normocephalic, atraumatic skull.  There are no ocular or oral lesions.  There are no palpable cervical or supraclavicular lymph nodes.  Lungs:  Clear bilaterally. Cardiac:  Regular rate and rhythm with a normal S1, S2.  There are no murmurs, rubs or bruits.  Abdomen:  Soft with good bowel sounds.  There is no palpable abdominal mass.  There is no fluid wave.  There is no palpable hepatosplenomegaly.  Back:  No tenderness of the spine, ribs, or hips.  Extremities:  Show no clubbing, cyanosis or edema.  Skin:  No rashes, ecchymosis, or petechia.  LABORATORIES:  White cell count 3.9, hemoglobin 11.4, hematocrit 35.5, platelet count 178.  MCV is 99.  Peripheral smear shows good maturation of the white blood cells.  I see no immature myeloid cells.  Red cells are normal in morphology and maturation.  There is no nucleated red blood cells.  Platelets are adequate in number and size.  IMPRESSION:  [name] is a 47 year old white female with chronic phase chronic myeloid leukemia.  She is responding well to Sprycel.  She is in a hematologic remission from my point of view.  We will see what her BCR/ABL  ratio is now.  We will continue to have her come back monthly for lab work.  I will plan to see her back myself in 3 months now as she is doing so well.    ______________________________ Josph Macho, M.D. PRE/MEDQ  D:  05/27/2013  T:  05/28/2013  Job:  [id number]

## 2013-06-24 ENCOUNTER — Other Ambulatory Visit (HOSPITAL_BASED_OUTPATIENT_CLINIC_OR_DEPARTMENT_OTHER): Payer: BC Managed Care – PPO | Admitting: Lab

## 2013-06-24 DIAGNOSIS — C9210 Chronic myeloid leukemia, BCR/ABL-positive, not having achieved remission: Secondary | ICD-10-CM

## 2013-06-24 LAB — CBC WITH DIFFERENTIAL (CANCER CENTER ONLY)
BASO%: 0.6 % (ref 0.0–2.0)
EOS%: 3.3 % (ref 0.0–7.0)
Eosinophils Absolute: 0.2 10*3/uL (ref 0.0–0.5)
MCH: 33.3 pg (ref 26.0–34.0)
MCHC: 34.1 g/dL (ref 32.0–36.0)
MONO%: 7.8 % (ref 0.0–13.0)
NEUT#: 2.5 10*3/uL (ref 1.5–6.5)
Platelets: 191 10*3/uL (ref 145–400)
RBC: 3.72 10*6/uL (ref 3.70–5.32)
RDW: 15.2 % (ref 11.1–15.7)

## 2013-06-24 LAB — MAGNESIUM: Magnesium: 2.3 mg/dL (ref 1.5–2.5)

## 2013-06-24 LAB — BASIC METABOLIC PANEL
Calcium: 9.5 mg/dL (ref 8.4–10.5)
Sodium: 139 mEq/L (ref 135–145)

## 2013-06-26 ENCOUNTER — Telehealth: Payer: Self-pay | Admitting: *Deleted

## 2013-06-26 NOTE — Telephone Encounter (Signed)
Pt called regarding lab results from 06/24/13. CBC normal! Pt verbalized understanding.

## 2013-07-02 ENCOUNTER — Other Ambulatory Visit: Payer: Self-pay | Admitting: Dermatology

## 2013-07-22 ENCOUNTER — Other Ambulatory Visit (HOSPITAL_BASED_OUTPATIENT_CLINIC_OR_DEPARTMENT_OTHER): Payer: BC Managed Care – PPO | Admitting: Lab

## 2013-07-22 DIAGNOSIS — C9210 Chronic myeloid leukemia, BCR/ABL-positive, not having achieved remission: Secondary | ICD-10-CM

## 2013-07-22 LAB — BASIC METABOLIC PANEL
BUN: 14 mg/dL (ref 6–23)
CO2: 27 mEq/L (ref 19–32)
Chloride: 104 mEq/L (ref 96–112)
Creatinine, Ser: 0.9 mg/dL (ref 0.50–1.10)
Potassium: 3.9 mEq/L (ref 3.5–5.3)

## 2013-07-22 LAB — CBC WITH DIFFERENTIAL (CANCER CENTER ONLY)
BASO#: 0 10*3/uL (ref 0.0–0.2)
HCT: 36 % (ref 34.8–46.6)
HGB: 11.7 g/dL (ref 11.6–15.9)
LYMPH#: 1.8 10*3/uL (ref 0.9–3.3)
MONO#: 0.3 10*3/uL (ref 0.1–0.9)
NEUT%: 46.1 % (ref 39.6–80.0)
RBC: 3.66 10*6/uL — ABNORMAL LOW (ref 3.70–5.32)
WBC: 4.4 10*3/uL (ref 3.9–10.0)

## 2013-07-31 ENCOUNTER — Telehealth: Payer: Self-pay | Admitting: *Deleted

## 2013-07-31 NOTE — Telephone Encounter (Signed)
Called patient and left message on personal answering machine that her CML is now in remission per dr. Myna Hidalgo

## 2013-07-31 NOTE — Telephone Encounter (Signed)
Message copied by Anselm Jungling on Fri Jul 31, 2013  9:56 AM ------      Message from: Arlan Organ R      Created: Thu Jul 30, 2013  7:41 AM       Please call and let her know that her CML is now in remission!!  Eugenie Birks!!!!  [employer] job!!! Cindee Lame       ------

## 2013-08-20 ENCOUNTER — Other Ambulatory Visit (HOSPITAL_BASED_OUTPATIENT_CLINIC_OR_DEPARTMENT_OTHER): Payer: BC Managed Care – PPO | Admitting: Lab

## 2013-08-20 ENCOUNTER — Ambulatory Visit (HOSPITAL_BASED_OUTPATIENT_CLINIC_OR_DEPARTMENT_OTHER): Payer: BC Managed Care – PPO | Admitting: Hematology & Oncology

## 2013-08-20 VITALS — BP 105/65 | HR 68 | Temp 98.3°F | Resp 14 | Ht 70.0 in | Wt 166.0 lb

## 2013-08-20 DIAGNOSIS — C9210 Chronic myeloid leukemia, BCR/ABL-positive, not having achieved remission: Secondary | ICD-10-CM

## 2013-08-20 LAB — CBC WITH DIFFERENTIAL (CANCER CENTER ONLY)
BASO#: 0 10*3/uL (ref 0.0–0.2)
Eosinophils Absolute: 0.1 10*3/uL (ref 0.0–0.5)
HGB: 11.8 g/dL (ref 11.6–15.9)
MCH: 32.2 pg (ref 26.0–34.0)
MCV: 98 fL (ref 81–101)
MONO#: 0.8 10*3/uL (ref 0.1–0.9)
MONO%: 13.3 % — ABNORMAL HIGH (ref 0.0–13.0)
NEUT#: 2.7 10*3/uL (ref 1.5–6.5)
Platelets: 166 10*3/uL (ref 145–400)
RBC: 3.67 10*6/uL — ABNORMAL LOW (ref 3.70–5.32)
WBC: 5.8 10*3/uL (ref 3.9–10.0)

## 2013-08-20 LAB — BASIC METABOLIC PANEL
BUN: 10 mg/dL (ref 6–23)
CO2: 28 mEq/L (ref 19–32)
Creatinine, Ser: 0.99 mg/dL (ref 0.50–1.10)
Glucose, Bld: 109 mg/dL — ABNORMAL HIGH (ref 70–99)
Potassium: 3.9 mEq/L (ref 3.5–5.3)

## 2013-08-20 NOTE — Progress Notes (Signed)
This office note has been dictated.

## 2013-08-21 NOTE — Progress Notes (Signed)
CC:   Christina Kelly. Christina Kelly, M.D.  DIAGNOSIS:  Chronic phase chronic myelogenous leukemia, major molecular remission.  CURRENT THERAPY:  Sprycel 70 mg p.o. daily.  INTERIM HISTORY:  Christina Kelly comes in for a followup.  She continues to do well on the Sprycel.  It has caused some skin issues.  It also has caused a little bit of swelling in her legs.  We do have her on Maxzide to try to help with any kind of fluid retention.  Her last BCR/ABL analysis showed her to be in a major molecular remission.  She attained this within 3 months of starting Sprycel.  This definitely bodes very well for her for long-term prognosis.  She has had no problems with cough or shortness of breath.  She has a little bit of a sore throat now.  Her son just got over a viral infection.  She has had no problems with bowels or bladder.  She is thinking about having her ovaries removed as she keeps coming up with these ovarian cysts.  I told that I did not think surgery would be a problem with her being on Sprycel.  We could always stop the Sprycel a week or so before surgery if necessary.  She has had no rash.  She has had no headache.  She has had no joint aches or pains.  She is still trying to exercise.  PHYSICAL EXAMINATION:  General:  This is a well-developed, well- nourished white female in no obvious distress.  Vital signs: Temperature of 98.3, pulse 68, respiratory rate 14, blood pressure 105/65.  Weight is 166 pounds.  Head and neck exam shows a normocephalic, atraumatic skull.  There are no ocular or oral lesions. There are no palpable cervical or supraclavicular lymph nodes.  Lungs are clear bilaterally.  Cardiac:  Regular rate and rhythm with a normal S1, S2.  There are no murmurs, rubs or bruits.  Abdomen is soft.  She has good bowel sounds.  There is no fluid wave.  There is no palpable abdominal mass.  There is no palpable hepatosplenomegaly.  Extremities show no clubbing, cyanosis or edema.   Neurological exam shows no focal neurological deficits.  Skin:  No rashes, ecchymosis, or petechiae.  LABORATORY STUDIES:  White cell count 5.8, hemoglobin 11.8, hematocrit 35.9, platelet count 166.  MCV is 98.  Peripheral smear shows good maturation of her white blood cells.  I see no immature myeloid or lymphoid forms.  She has no blasts.  Red cells are with no nucleated red cells.  She has no schistocytes or spherocytes.  Platelets are adequate in number and size.  IMPRESSION:  Christina Kelly is a very charming 47 year old white female with chronic phase chronic myelogenous leukemia.  She is on Sprycel. She has been on Sprycel now for about 5 months.  Again, she has attained a major molecular remission.  I think this point, we can get her back in 3 months.  She has been coming back monthly.  I think we can stop the monthly labs as she is maintaining her blood counts and we have not found any electrolyte issues.  I will plan see back in 3 months.  We will get her through the holidays.    ______________________________ Josph Macho, M.D. PRE/MEDQ  D:  08/20/2013  T:  08/21/2013  Job:  [id number]

## 2013-08-27 ENCOUNTER — Encounter: Payer: Self-pay | Admitting: Nurse Practitioner

## 2013-08-27 NOTE — Progress Notes (Signed)
Rx sent to [facility] [phone]) for Sprycel 70mg . Signed via Dr. Arbutus Ped.

## 2013-09-10 ENCOUNTER — Other Ambulatory Visit: Payer: Self-pay

## 2013-10-02 ENCOUNTER — Telehealth: Payer: Self-pay | Admitting: *Deleted

## 2013-10-02 ENCOUNTER — Ambulatory Visit (HOSPITAL_BASED_OUTPATIENT_CLINIC_OR_DEPARTMENT_OTHER)
Admission: RE | Admit: 2013-10-02 | Discharge: 2013-10-02 | Disposition: A | Discharge status: Home / Self Care | Payer: BC Managed Care – PPO | Source: Ambulatory Visit | Attending: Hematology & Oncology | Admitting: Hematology & Oncology

## 2013-10-02 DIAGNOSIS — C9210 Chronic myeloid leukemia, BCR/ABL-positive, not having achieved remission: Secondary | ICD-10-CM

## 2013-10-02 DIAGNOSIS — M7989 Other specified soft tissue disorders: Secondary | ICD-10-CM | POA: Insufficient documentation

## 2013-10-02 DIAGNOSIS — R609 Edema, unspecified: Secondary | ICD-10-CM

## 2013-10-02 DIAGNOSIS — R600 Localized edema: Secondary | ICD-10-CM

## 2013-10-02 MED ORDER — METHYLPREDNISOLONE 4 MG PO KIT: PACK | ORAL | Status: DC

## 2013-10-02 NOTE — Telephone Encounter (Signed)
Pt was made aware of her negative [place] after her 1:30 appt. Further discussed issues with fluid retention/edema in her right foot and calf tightness with Dr Myna Hidalgo. After reviewing the data on Sprycel (Important Safety Information). It was determined to try a short course of steroid along with Maxzide prn. Left message on pt's cell giving her this info and asking to call back with further questions. Sent medrol dosepak to [facility] on file.

## 2013-10-02 NOTE — Telephone Encounter (Addendum)
Pt called stating she was having swelling in her right foot and calf area. Denies warmth or redness. Had previously been prescribed triamterene-HCTZ prn along with elevation per Dr Myna Hidalgo with minimal effectiveness. Reviewed with Dr. Myna Hidalgo. To have an US of the right lower ext today. Left message on pt's cell phone at 1222 stating an US of the leg has been scheduled for 1330. To call the office or radiology if she has any questions or concerns. But encouraged to go at the scheduled time as Dr Myna Hidalgo is on call and will need to leave the office this afternoon.

## 2013-11-20 ENCOUNTER — Encounter: Payer: Self-pay | Admitting: Hematology & Oncology

## 2013-11-20 ENCOUNTER — Other Ambulatory Visit (HOSPITAL_BASED_OUTPATIENT_CLINIC_OR_DEPARTMENT_OTHER): Payer: BC Managed Care – PPO | Admitting: Lab

## 2013-11-20 ENCOUNTER — Ambulatory Visit (HOSPITAL_BASED_OUTPATIENT_CLINIC_OR_DEPARTMENT_OTHER): Payer: BC Managed Care – PPO | Admitting: Hematology & Oncology

## 2013-11-20 VITALS — BP 108/64 | HR 61 | Temp 98.0°F | Resp 14 | Ht 71.0 in | Wt 167.0 lb

## 2013-11-20 DIAGNOSIS — C9210 Chronic myeloid leukemia, BCR/ABL-positive, not having achieved remission: Secondary | ICD-10-CM

## 2013-11-20 LAB — COMPREHENSIVE METABOLIC PANEL
ALT: 15 U/L (ref 0–35)
AST: 15 U/L (ref 0–37)
Albumin: 4.5 g/dL (ref 3.5–5.2)
Alkaline Phosphatase: 44 U/L (ref 39–117)
BUN: 12 mg/dL (ref 6–23)
CALCIUM: 9.5 mg/dL (ref 8.4–10.5)
CHLORIDE: 102 meq/L (ref 96–112)
CO2: 27 mEq/L (ref 19–32)
Creatinine, Ser: 1.04 mg/dL (ref 0.50–1.10)
GLUCOSE: 86 mg/dL (ref 70–99)
Potassium: 4.1 mEq/L (ref 3.5–5.3)
Sodium: 138 mEq/L (ref 135–145)
Total Bilirubin: 0.4 mg/dL (ref 0.3–1.2)
Total Protein: 6.8 g/dL (ref 6.0–8.3)

## 2013-11-20 LAB — CBC WITH DIFFERENTIAL (CANCER CENTER ONLY)
BASO#: 0 10*3/uL (ref 0.0–0.2)
BASO%: 0.4 % (ref 0.0–2.0)
EOS%: 3.6 % (ref 0.0–7.0)
Eosinophils Absolute: 0.2 10*3/uL (ref 0.0–0.5)
HEMATOCRIT: 37.4 % (ref 34.8–46.6)
HGB: 12.1 g/dL (ref 11.6–15.9)
LYMPH#: 2.4 10*3/uL (ref 0.9–3.3)
LYMPH%: 42.3 % (ref 14.0–48.0)
MCH: 31.3 pg (ref 26.0–34.0)
MCHC: 32.4 g/dL (ref 32.0–36.0)
MCV: 97 fL (ref 81–101)
MONO#: 0.4 10*3/uL (ref 0.1–0.9)
MONO%: 6.7 % (ref 0.0–13.0)
NEUT#: 2.6 10*3/uL (ref 1.5–6.5)
NEUT%: 47 % (ref 39.6–80.0)
Platelets: 166 10*3/uL (ref 145–400)
RBC: 3.86 10*6/uL (ref 3.70–5.32)
RDW: 12.9 % (ref 11.1–15.7)
WBC: 5.6 10*3/uL (ref 3.9–10.0)

## 2013-11-20 LAB — MAGNESIUM: Magnesium: 2.2 mg/dL (ref 1.5–2.5)

## 2013-11-20 NOTE — Progress Notes (Signed)
This office note has been dictated.

## 2013-11-21 NOTE — Progress Notes (Signed)
CC:   Emeline General. Dema Severin, M.D.  DIAGNOSIS:  Chronic phase, chronic myeloid leukemia-major molecular remission.  CURRENT THERAPY:  Sprycel 70 mg p.o. daily.  INTERIM HISTORY:  Christina Kelly comes in for followup.  She is doing quite well.  She is doing well with Sprycel.  She has occasional leg swelling with this.  We actually did do a Doppler of her leg.  This was done back in November 28.  Thankfully, this did not show any evidence of DVT.  This is of the right leg.  We check her BCR/ABL analysis.  We last checked this back in September. Again, she was in a major molecular remission without any BCR/ABL detected.  She is having no problems with bowels or bladder.  She is going to have a hysterectomy on February 26.  I spoke with her pharmacist.  We do not have to stop the Sprycel before her surgery.  Otherwise, she is doing okay.  Her son is doing quite well.  They enjoyed a nice Christmas together.  She has had no rashes.  She has had no diarrhea or constipation.  She has had no cough or shortness of breath.  PHYSICAL EXAMINATION:  General:  This is a well-developed, well- nourished white female in no obvious distress.  Vital Signs: Temperature of 98, pulse 61, respiratory rate 18, blood pressure 108/64, weight is 169 pounds.  Head and Neck:  Normocephalic, atraumatic skull. There are no ocular or oral lesions.  There are no palpable cervical or supraclavicular lymph nodes.  Lungs:  Clear bilaterally.  Cardiac: Regular rate and rhythm with a normal S1 and S2.  There are no murmurs, rubs, or bruits.  Abdomen:  Soft.  She has good bowel sounds.  There is no fluid wave.  There is no palpable abdominal mass.  There is no palpable hepatosplenomegaly.  Back:  No tenderness over the spine, ribs, or hips.  Extremities:  Show no clubbing, cyanosis, or edema. Neurological:  Shows no focal neurological deficits.  LABORATORY STUDIES:  White cell count is 5.6, hemoglobin  12.1, hematocrit 37.4, platelet count 166.  Peripheral smear shows good maturation of her white blood cells.  I see no immature myeloid cells.  She has no blasts.  There are no nucleated red blood cells.  She has no rouleaux formation, no target cells. Platelets are adequate in number and size.  IMPRESSION:  Christina Kelly is a very charming 48 year old white female. She presented with chronic myeloid leukemia.  She presented in chronic phase.  She has responded beautifully to Sprycel.  Again, she is in a major molecular remission.  We will go ahead and plan to follow up in 4 months.  We will need to do an another bone marrow test on her.  We will probably get the bone marrow test setup after I see her.  We will probably do this in June.  I reviewed her lab work with her.    ______________________________ [name], M.D. PRE/MEDQ  D:  11/20/2013  T:  11/21/2013  Job:  [id number]

## 2013-11-24 ENCOUNTER — Telehealth: Payer: Self-pay | Admitting: *Deleted

## 2013-11-24 NOTE — Telephone Encounter (Addendum)
Message copied by Orlando Penner on Tue Nov 24, 2013  3:32 PM ------      Message from: [name]      Created: Tue Nov 24, 2013 12:51 PM       Call - NO detectable leukemia cells!!  EXCELLENT!!  Pete ------This message given to pt.  Voiced understanding.  Asked if she needed to stop Spycel before her surgery next month.  No per Dr. Antonieta Pert note.

## 2013-12-17 ENCOUNTER — Encounter: Payer: Self-pay | Admitting: Hematology & Oncology

## 2013-12-18 ENCOUNTER — Encounter (HOSPITAL_COMMUNITY): Payer: Self-pay | Admitting: Pharmacist

## 2013-12-22 NOTE — H&P (Addendum)
Christina Kelly presents today for LAVH and BSO.  She has had follow-up evaluation of ovarian cyst.  She is relaitvely asymptomatic.  Previously we talked about different options.  She does have a history of endometriosis.  We discussed the possibility of ovulation suppression with Minastrin.  She ultimately ended up taking the pills.  She does have a mildly elevated FSH at 16.1.  She is having regular cycles.Christina Kelly presents today for consultation regarding irregular cycles and ovarian cyst.  She did not bring records with her, but Christina Kelly is currently with a domestic partner.  She had a 3-week long period in April.  Presented to [facility] where she sees a Designer, jewellery.  They did some blood work including a CBC, and she had an extremely elevated white count.  They sent her to a hematologist, and they diagnosed her with CML.  She has been treated since then with Sprycel.  Apparently, they had done an ultrasound, and they had shown a large right centimeter cyst and a left centimeter cyst.  Had followup 6-8 weeks later and showed that the right one had improved, but then she had a new one on the right ovary which was now 5 cm with septations.  The previous one had gone down to 2.5 cm, and then she now had one still on the left ovary.  She has intermittent discomfort but nothing severe.  She is concerned that since she is now hematologically in remission on the Sprycel with normal blood counts.  She is concerned about the ovarian cyst and concerns about possibly ovarian cancers and wants more aggressive management.  She also has been having some irregular cycling since then and had a very heavy cycle for 2 days just last week, but that was not her normal time either.    O: Ultrasound evaluation today does show a right ovarian cyst which is very similar in size and nature as before with low-level echoes.  There is a suspicion for either hemorrhagic cyst or endometrioma.  Today there is a left ovarian simple cyst  which is 2 cm in size which was not present back in July.  Blood flow seen to both ovaries but not within the cyst. A&P: Ovarian cysts, right side suspicious for endometrioma, left side is a simple cyst.  Discussed the possibility of ovulation suppression if this is endometriosis and the benefit of that.  She is not sure if she actually wants to do that.  Discussed birth control pills at length, pros and cons, risks and benefits.  Because there has not been any significant change, I am reassured by that, and with a normal CA-125.   Reviewed her past records and past medical history and Christina Kelly has a lot of questions with regard to removal of the ovaries, menopause, menopausal therapy, risks associated with hormone replacement therapy, risks and benefits of removing or leaving the ovaries.  She does have an ovarian cyst on the right side suspicious for endometrioma and a left-sided simple cyst.  She has a history of chemotherapy for leukemia that she has completed.  She has had normal CA-125.  Again, I discussed the pros and cons and risks and benefits of removing those.  Also with removing the uterus or without removing the uterus  At this time, she wants to proceed with LAVH/BSO due to recurrent ovarian cyst, intermittant pelvic pain, history of endometriosis and history of leukemia.  Risks and benefits of the procedure were discussed at length which include but not  limited to risks of infection, bleeding, damage to adjacent organs, risks of anesthesia, blood transfusion, or possibility the pain may not improve, worsen or recur.  She gives her informed consent and wishes to proceed. Christina Sabal Corinna Capra, MD/rg   12/31/13 0715 This patient has been seen and examined.   All of her questions were answered.  Labs and vital signs reviewed.  Informed consent has been obtained.  The History and Physical is current. DL

## 2013-12-29 ENCOUNTER — Encounter (HOSPITAL_COMMUNITY): Payer: Self-pay

## 2013-12-29 ENCOUNTER — Encounter (HOSPITAL_COMMUNITY)
Admission: RE | Admit: 2013-12-29 | Discharge: 2013-12-29 | Disposition: A | Discharge status: Home / Self Care | Payer: BC Managed Care – PPO | Source: Ambulatory Visit | Attending: Obstetrics and Gynecology | Admitting: Obstetrics and Gynecology

## 2013-12-29 LAB — CBC
HCT: 37.5 % (ref 36.0–46.0)
Hemoglobin: 12.5 g/dL (ref 12.0–15.0)
MCH: 31.4 pg (ref 26.0–34.0)
MCHC: 33.3 g/dL (ref 30.0–36.0)
MCV: 94.2 fL (ref 78.0–100.0)
Platelets: 228 10*3/uL (ref 150–400)
RBC: 3.98 MIL/uL (ref 3.87–5.11)
RDW: 13.4 % (ref 11.5–15.5)
WBC: 5.8 10*3/uL (ref 4.0–10.5)

## 2013-12-29 NOTE — Patient Instructions (Signed)
20 Christina Kelly  12/29/2013   Your procedure is scheduled on:  12/31/13  Enter through the Main Entrance of [facility] [place] up the phone at the desk and dial [phone].   Call this number if you have problems the morning of surgery: [phone]   Remember:   Do not eat food:After Midnight.  Do not drink clear liquids: After Midnight.  Take these medicines the morning of surgery with A SIP OF WATER: none   Do not wear jewelry, make-up or nail polish.  Do not wear lotions, powders, or perfumes. You may wear deodorant.  Do not shave 24 hours prior to surgery.  Do not bring valuables to the hospital.  [facility] is not   responsible for any belongings or valuables brought to the hospital.  Contacts, dentures or bridgework may not be worn into surgery.  Leave suitcase in the car. After surgery it may be brought to your room.  For patients admitted to the hospital, checkout time is 11:00 AM the day of              discharge.   Patients discharged the day of surgery will not be allowed to drive             home.  Name and phone number of your driver: NA  Special Instructions:      Please read over the following fact sheets that you were given:   Surgical Site Infection Prevention

## 2013-12-30 MED ORDER — CEFOTETAN DISODIUM 2 G IJ SOLR
2.0000 g | INTRAMUSCULAR | Status: AC
  Administered 2013-12-31: 2 g via INTRAVENOUS
  Filled 2013-12-30: qty 2

## 2013-12-31 ENCOUNTER — Observation Stay (HOSPITAL_COMMUNITY)
Admission: RE | Admit: 2013-12-31 | Discharge: 2014-01-01 | Disposition: A | Discharge status: Home / Self Care | Payer: BC Managed Care – PPO | Source: Ambulatory Visit | Attending: Obstetrics and Gynecology | Admitting: Obstetrics and Gynecology

## 2013-12-31 ENCOUNTER — Encounter (HOSPITAL_COMMUNITY)
Admission: RE | Disposition: A | Discharge status: Home / Self Care | Payer: Self-pay | Source: Ambulatory Visit | Attending: Obstetrics and Gynecology

## 2013-12-31 ENCOUNTER — Encounter (HOSPITAL_COMMUNITY): Payer: BC Managed Care – PPO | Admitting: Anesthesiology

## 2013-12-31 ENCOUNTER — Ambulatory Visit (HOSPITAL_COMMUNITY): Payer: BC Managed Care – PPO | Admitting: Anesthesiology

## 2013-12-31 ENCOUNTER — Encounter (HOSPITAL_COMMUNITY): Payer: Self-pay | Admitting: Anesthesiology

## 2013-12-31 DIAGNOSIS — Z90710 Acquired absence of both cervix and uterus: Secondary | ICD-10-CM | POA: Diagnosis present

## 2013-12-31 DIAGNOSIS — N803 Endometriosis of pelvic peritoneum, unspecified: Secondary | ICD-10-CM | POA: Insufficient documentation

## 2013-12-31 DIAGNOSIS — Z9221 Personal history of antineoplastic chemotherapy: Secondary | ICD-10-CM | POA: Insufficient documentation

## 2013-12-31 DIAGNOSIS — N801 Endometriosis of ovary: Secondary | ICD-10-CM | POA: Insufficient documentation

## 2013-12-31 DIAGNOSIS — N83209 Unspecified ovarian cyst, unspecified side: Principal | ICD-10-CM | POA: Insufficient documentation

## 2013-12-31 DIAGNOSIS — Z8742 Personal history of other diseases of the female genital tract: Secondary | ICD-10-CM | POA: Insufficient documentation

## 2013-12-31 DIAGNOSIS — N838 Other noninflammatory disorders of ovary, fallopian tube and broad ligament: Secondary | ICD-10-CM | POA: Insufficient documentation

## 2013-12-31 DIAGNOSIS — D72829 Elevated white blood cell count, unspecified: Secondary | ICD-10-CM | POA: Insufficient documentation

## 2013-12-31 DIAGNOSIS — N949 Unspecified condition associated with female genital organs and menstrual cycle: Secondary | ICD-10-CM | POA: Insufficient documentation

## 2013-12-31 DIAGNOSIS — N80109 Endometriosis of ovary, unspecified side, unspecified depth: Secondary | ICD-10-CM | POA: Insufficient documentation

## 2013-12-31 HISTORY — PX: SALPINGOOPHORECTOMY: SHX82

## 2013-12-31 HISTORY — PX: LAPAROSCOPIC ASSISTED VAGINAL HYSTERECTOMY: SHX5398

## 2013-12-31 LAB — CBC
HEMATOCRIT: 36.2 % (ref 36.0–46.0)
HEMOGLOBIN: 12 g/dL (ref 12.0–15.0)
MCH: 31.3 pg (ref 26.0–34.0)
MCHC: 33.1 g/dL (ref 30.0–36.0)
MCV: 94.3 fL (ref 78.0–100.0)
Platelets: 181 10*3/uL (ref 150–400)
RBC: 3.84 MIL/uL — ABNORMAL LOW (ref 3.87–5.11)
RDW: 13.5 % (ref 11.5–15.5)
WBC: 13 10*3/uL — AB (ref 4.0–10.5)

## 2013-12-31 SURGERY — HYSTERECTOMY, VAGINAL, LAPAROSCOPY-ASSISTED
Anesthesia: General

## 2013-12-31 MED ORDER — MIDAZOLAM HCL 5 MG/5ML IJ SOLN
INTRAMUSCULAR | Status: DC | PRN
  Administered 2013-12-31: 2 mg via INTRAVENOUS

## 2013-12-31 MED ORDER — PROPOFOL 10 MG/ML IV BOLUS
INTRAVENOUS | Status: DC | PRN
  Administered 2013-12-31: 170 mg via INTRAVENOUS

## 2013-12-31 MED ORDER — SODIUM CHLORIDE 0.9 % IJ SOLN
3.0000 mL | Freq: Two times a day (BID) | INTRAMUSCULAR | Status: DC
  Administered 2013-12-31: 3 mL via INTRAVENOUS

## 2013-12-31 MED ORDER — PROPOFOL 10 MG/ML IV EMUL
INTRAVENOUS | Status: AC
  Filled 2013-12-31: qty 20

## 2013-12-31 MED ORDER — MIDAZOLAM HCL 2 MG/2ML IJ SOLN: 0.5000 mg | Freq: Once | INTRAMUSCULAR | Status: DC | PRN

## 2013-12-31 MED ORDER — MENTHOL 3 MG MT LOZG: 1.0000 | LOZENGE | OROMUCOSAL | Status: DC | PRN

## 2013-12-31 MED ORDER — ONDANSETRON HCL 4 MG/2ML IJ SOLN
4.0000 mg | Freq: Four times a day (QID) | INTRAMUSCULAR | Status: DC | PRN
Start: 2013-12-31 — End: 2014-01-01

## 2013-12-31 MED ORDER — NALOXONE HCL 0.4 MG/ML IJ SOLN: 0.4000 mg | INTRAMUSCULAR | Status: DC | PRN

## 2013-12-31 MED ORDER — NEOSTIGMINE METHYLSULFATE 1 MG/ML IJ SOLN
INTRAMUSCULAR | Status: AC
  Filled 2013-12-31: qty 1

## 2013-12-31 MED ORDER — HYDROMORPHONE 0.3 MG/ML IV SOLN
INTRAVENOUS | Status: DC
  Administered 2013-12-31: 0.4 mg via INTRAVENOUS
  Administered 2013-12-31: 3.75 mL via INTRAVENOUS
  Administered 2013-12-31: 11:00:00 via INTRAVENOUS
  Administered 2013-12-31: 2 mL via INTRAVENOUS
  Administered 2013-12-31: 1.79 mg via INTRAVENOUS
  Filled 2013-12-31: qty 25

## 2013-12-31 MED ORDER — PROMETHAZINE HCL 25 MG/ML IJ SOLN
6.2500 mg | INTRAMUSCULAR | Status: DC | PRN
  Administered 2013-12-31: 7.5 mg via INTRAVENOUS

## 2013-12-31 MED ORDER — KETOROLAC TROMETHAMINE 30 MG/ML IJ SOLN: 15.0000 mg | Freq: Once | INTRAMUSCULAR | Status: DC | PRN

## 2013-12-31 MED ORDER — FENTANYL CITRATE 0.05 MG/ML IJ SOLN
INTRAMUSCULAR | Status: AC
  Filled 2013-12-31: qty 5

## 2013-12-31 MED ORDER — MIDAZOLAM HCL 2 MG/2ML IJ SOLN
INTRAMUSCULAR | Status: AC
  Filled 2013-12-31: qty 2

## 2013-12-31 MED ORDER — HYDROMORPHONE HCL PF 1 MG/ML IJ SOLN: 0.2000 mg | INTRAMUSCULAR | Status: DC | PRN

## 2013-12-31 MED ORDER — LIDOCAINE 1%/NA BICARB 0.1 MEQ INJECTION
INJECTION | INTRAVENOUS | Status: AC
Start: 2013-12-31 — End: 2013-12-31
  Filled 2013-12-31: qty 1

## 2013-12-31 MED ORDER — ONDANSETRON HCL 4 MG/2ML IJ SOLN
INTRAMUSCULAR | Status: AC
  Filled 2013-12-31: qty 2

## 2013-12-31 MED ORDER — GLYCOPYRROLATE 0.2 MG/ML IJ SOLN
INTRAMUSCULAR | Status: AC
  Filled 2013-12-31: qty 4

## 2013-12-31 MED ORDER — ROCURONIUM BROMIDE 100 MG/10ML IV SOLN
INTRAVENOUS | Status: DC | PRN
  Administered 2013-12-31: 50 mg via INTRAVENOUS

## 2013-12-31 MED ORDER — PROMETHAZINE HCL 25 MG/ML IJ SOLN
INTRAMUSCULAR | Status: AC
  Filled 2013-12-31: qty 1

## 2013-12-31 MED ORDER — DEXAMETHASONE SODIUM PHOSPHATE 10 MG/ML IJ SOLN
INTRAMUSCULAR | Status: AC
  Filled 2013-12-31: qty 1

## 2013-12-31 MED ORDER — OXYCODONE-ACETAMINOPHEN 5-325 MG PO TABS
1.0000 | ORAL_TABLET | ORAL | Status: DC | PRN
  Administered 2013-12-31 – 2014-01-01 (×3): 1 via ORAL
  Filled 2013-12-31 (×3): qty 1

## 2013-12-31 MED ORDER — GLYCOPYRROLATE 0.2 MG/ML IJ SOLN
INTRAMUSCULAR | Status: DC | PRN
  Administered 2013-12-31: 0.2 mg via INTRAVENOUS

## 2013-12-31 MED ORDER — IBUPROFEN 600 MG PO TABS
600.0000 mg | ORAL_TABLET | Freq: Four times a day (QID) | ORAL | Status: DC | PRN
  Administered 2013-12-31 – 2014-01-01 (×2): 600 mg via ORAL
  Filled 2013-12-31 (×2): qty 1

## 2013-12-31 MED ORDER — LIDOCAINE HCL (CARDIAC) 20 MG/ML IV SOLN
INTRAVENOUS | Status: AC
  Filled 2013-12-31: qty 5

## 2013-12-31 MED ORDER — DASATINIB 70 MG PO TABS
70.0000 mg | ORAL_TABLET | ORAL | Status: DC
  Administered 2013-12-31: 70 mg via ORAL

## 2013-12-31 MED ORDER — HYDROMORPHONE HCL PF 1 MG/ML IJ SOLN
INTRAMUSCULAR | Status: AC
  Filled 2013-12-31: qty 1

## 2013-12-31 MED ORDER — SODIUM CHLORIDE 0.9 % IJ SOLN: 9.0000 mL | INTRAMUSCULAR | Status: DC | PRN

## 2013-12-31 MED ORDER — BUPIVACAINE HCL (PF) 0.25 % IJ SOLN
INTRAMUSCULAR | Status: DC | PRN
  Administered 2013-12-31: 10 mL

## 2013-12-31 MED ORDER — ONDANSETRON HCL 4 MG/2ML IJ SOLN
INTRAMUSCULAR | Status: DC | PRN
  Administered 2013-12-31: 4 mg via INTRAVENOUS

## 2013-12-31 MED ORDER — ALPRAZOLAM 0.25 MG PO TABS: 0.1250 mg | ORAL_TABLET | Freq: Every evening | ORAL | Status: DC | PRN

## 2013-12-31 MED ORDER — DIPHENHYDRAMINE HCL 12.5 MG/5ML PO ELIX: 12.5000 mg | ORAL_SOLUTION | Freq: Four times a day (QID) | ORAL | Status: DC | PRN

## 2013-12-31 MED ORDER — LACTATED RINGERS IV SOLN
INTRAVENOUS | Status: DC
  Administered 2013-12-31 (×2): via INTRAVENOUS

## 2013-12-31 MED ORDER — MEPERIDINE HCL 25 MG/ML IJ SOLN: 6.2500 mg | INTRAMUSCULAR | Status: DC | PRN

## 2013-12-31 MED ORDER — DIPHENHYDRAMINE HCL 50 MG/ML IJ SOLN: 12.5000 mg | Freq: Four times a day (QID) | INTRAMUSCULAR | Status: DC | PRN

## 2013-12-31 MED ORDER — HYDROMORPHONE HCL PF 1 MG/ML IJ SOLN
INTRAMUSCULAR | Status: DC | PRN
  Administered 2013-12-31: 1 mg via INTRAVENOUS

## 2013-12-31 MED ORDER — BUPIVACAINE HCL (PF) 0.25 % IJ SOLN
INTRAMUSCULAR | Status: AC
  Filled 2013-12-31: qty 30

## 2013-12-31 MED ORDER — DEXTROSE-NACL 5-0.45 % IV SOLN
INTRAVENOUS | Status: DC
  Administered 2013-12-31 (×2): via INTRAVENOUS

## 2013-12-31 MED ORDER — LIDOCAINE HCL (CARDIAC) 20 MG/ML IV SOLN
INTRAVENOUS | Status: DC | PRN
  Administered 2013-12-31: 60 mg via INTRAVENOUS

## 2013-12-31 MED ORDER — HYDROMORPHONE HCL PF 1 MG/ML IJ SOLN: 0.2500 mg | INTRAMUSCULAR | Status: DC | PRN

## 2013-12-31 MED ORDER — FENTANYL CITRATE 0.05 MG/ML IJ SOLN
INTRAMUSCULAR | Status: DC | PRN
  Administered 2013-12-31: 100 ug via INTRAVENOUS
  Administered 2013-12-31 (×3): 50 ug via INTRAVENOUS

## 2013-12-31 MED ORDER — NEOSTIGMINE METHYLSULFATE 1 MG/ML IJ SOLN
INTRAMUSCULAR | Status: DC | PRN
  Administered 2013-12-31: 1.5 mg via INTRAVENOUS

## 2013-12-31 MED ORDER — DEXAMETHASONE SODIUM PHOSPHATE 10 MG/ML IJ SOLN
INTRAMUSCULAR | Status: DC | PRN
  Administered 2013-12-31: 10 mg via INTRAVENOUS

## 2013-12-31 SURGICAL SUPPLY — 47 items
BLADE SURG 15 STRL LF C SS BP (BLADE) ×2 IMPLANT
BLADE SURG 15 STRL SS (BLADE) ×1
CABLE HIGH FREQUENCY MONO STRZ (ELECTRODE) IMPLANT
CATH ROBINSON RED A/P 16FR (CATHETERS) ×3 IMPLANT
CLOTH BEACON ORANGE TIMEOUT ST (SAFETY) ×3 IMPLANT
CONT PATH 16OZ SNAP LID 3702 (MISCELLANEOUS) ×3 IMPLANT
COVER TABLE BACK 60X90 (DRAPES) ×3 IMPLANT
DECANTER SPIKE VIAL GLASS SM (MISCELLANEOUS) IMPLANT
DERMABOND ADHESIVE PROPEN (GAUZE/BANDAGES/DRESSINGS) ×1
DERMABOND ADVANCED (GAUZE/BANDAGES/DRESSINGS) ×1
DERMABOND ADVANCED .7 DNX12 (GAUZE/BANDAGES/DRESSINGS) ×2 IMPLANT
DERMABOND ADVANCED .7 DNX6 (GAUZE/BANDAGES/DRESSINGS) ×2 IMPLANT
DRSG COVADERM PLUS 2X2 (GAUZE/BANDAGES/DRESSINGS) ×3 IMPLANT
ELECT LIGASURE LONG (ELECTRODE) ×3 IMPLANT
ELECT REM PT RETURN 9FT ADLT (ELECTROSURGICAL)
ELECTRODE REM PT RTRN 9FT ADLT (ELECTROSURGICAL) IMPLANT
FORCEPS CUTTING 45CM 5MM (CUTTING FORCEPS) ×3 IMPLANT
GLOVE BIO SURGEON STRL SZ8 (GLOVE) ×3 IMPLANT
GLOVE BIOGEL M 7.0 STRL (GLOVE) ×3 IMPLANT
GLOVE BIOGEL PI IND STRL 6.5 (GLOVE) ×2 IMPLANT
GLOVE BIOGEL PI IND STRL 7.5 (GLOVE) ×2 IMPLANT
GLOVE BIOGEL PI INDICATOR 6.5 (GLOVE) ×1
GLOVE BIOGEL PI INDICATOR 7.5 (GLOVE) ×1
GLOVE SURG ORTHO 8.0 STRL STRW (GLOVE) ×9 IMPLANT
GOWN STRL REUS W/TWL LRG LVL3 (GOWN DISPOSABLE) ×9 IMPLANT
GOWN STRL REUS W/TWL XL LVL3 (GOWN DISPOSABLE) ×3 IMPLANT
NEEDLE INSUFFLATION 120MM (ENDOMECHANICALS) ×3 IMPLANT
NS IRRIG 1000ML POUR BTL (IV SOLUTION) ×3 IMPLANT
PACK LAVH (CUSTOM PROCEDURE TRAY) ×3 IMPLANT
PROTECTOR NERVE ULNAR (MISCELLANEOUS) ×3 IMPLANT
SET IRRIG TUBING LAPAROSCOPIC (IRRIGATION / IRRIGATOR) IMPLANT
SOLUTION ELECTROLUBE (MISCELLANEOUS) IMPLANT
STRIP CLOSURE SKIN 1/4X3 (GAUZE/BANDAGES/DRESSINGS) IMPLANT
SUT MNCRL 0 MO-4 VIOLET 18 CR (SUTURE) ×4 IMPLANT
SUT MNCRL 0 VIOLET 6X18 (SUTURE) ×2 IMPLANT
SUT MNCRL AB 0 CT1 27 (SUTURE) IMPLANT
SUT MON AB 2-0 CT1 36 (SUTURE) IMPLANT
SUT MONOCRYL 0 6X18 (SUTURE) ×1
SUT MONOCRYL 0 MO 4 18  CR/8 (SUTURE) ×2
SUT VICRYL 0 UR6 27IN ABS (SUTURE) ×3 IMPLANT
SUT VICRYL RAPIDE 3 0 (SUTURE) ×3 IMPLANT
TOWEL OR 17X24 6PK STRL BLUE (TOWEL DISPOSABLE) ×6 IMPLANT
TRAY FOLEY CATH 14FR (SET/KITS/TRAYS/PACK) ×3 IMPLANT
TROCAR OPTI TIP 5M 100M (ENDOMECHANICALS) ×3 IMPLANT
TROCAR XCEL DIL TIP R 11M (ENDOMECHANICALS) ×3 IMPLANT
WARMER LAPAROSCOPE (MISCELLANEOUS) ×3 IMPLANT
WATER STERILE IRR 1000ML POUR (IV SOLUTION) ×3 IMPLANT

## 2013-12-31 NOTE — Anesthesia Postprocedure Evaluation (Signed)
  Anesthesia Post-op Note  Patient: Christina Kelly  Procedure(s) Performed: Procedure(s): LAPAROSCOPIC ASSISTED VAGINAL HYSTERECTOMY (N/A) SALPINGO OOPHORECTOMY (Bilateral)  Patient Location: PACU  Anesthesia Type:General  Level of Consciousness: awake, alert  and oriented  Airway and Oxygen Therapy: Patient Spontanous Breathing  Post-op Pain: mild  Post-op Assessment: Post-op Vital signs reviewed, Patient's Cardiovascular Status Stable, Respiratory Function Stable, Patent Airway, No signs of Nausea or vomiting and Pain level controlled  Post-op Vital Signs: Reviewed and stable  Complications: No apparent anesthesia complications

## 2013-12-31 NOTE — Anesthesia Preprocedure Evaluation (Signed)
Anesthesia Evaluation  Patient identified by MRN, date of birth, ID band Patient awake    Reviewed: Allergy & Precautions, H&P , Patient's Chart, lab work & pertinent test results, reviewed documented beta blocker date and time   History of Anesthesia Complications Negative for: history of anesthetic complications  Airway Mallampati: II TM Distance: >3 FB Neck ROM: full    Dental   Pulmonary  breath sounds clear to auscultation        Cardiovascular Exercise Tolerance: Good Rhythm:regular Rate:Normal     Neuro/Psych    GI/Hepatic   Endo/Other    Renal/GU      Musculoskeletal   Abdominal   Peds  Hematology   Anesthesia Other Findings CML  Reproductive/Obstetrics                           Anesthesia Physical Anesthesia Plan  ASA: II  Anesthesia Plan: General ETT   Post-op Pain Management:    Induction:   Airway Management Planned:   Additional Equipment:   Intra-op Plan:   Post-operative Plan:   Informed Consent: I have reviewed the patients History and Physical, chart, labs and discussed the procedure including the risks, benefits and alternatives for the proposed anesthesia with the patient or authorized representative who has indicated his/her understanding and acceptance.   Dental Advisory Given  Plan Discussed with: CRNA and Surgeon  Anesthesia Plan Comments:         Anesthesia Quick Evaluation

## 2013-12-31 NOTE — Op Note (Signed)
NAMEBELKIS, Christina Kelly             ACCOUNT NO.:  [id number]  MEDICAL RECORD NO.:  [id number]  LOCATION:  [facility]                          FACILITY:  [facility]  PHYSICIAN:  Monia Sabal. Corinna Capra, M.D.    DATE OF BIRTH:  06-Apr-1966  DATE OF PROCEDURE:  12/31/2013 DATE OF DISCHARGE:                              OPERATIVE REPORT   PREOPERATIVE DIAGNOSES:  Pelvic pain.  History of endometriosis. History of endometriosis.  POSTOPERATIVE DIAGNOSES:  Pelvic pain.  History of endometriosis. History of endometriosis. plus pelvic adhesions.  PROCEDURE:  Laparoscopic-assisted vaginal hysterectomy with bilateral salpingo-oophorectomy, ablation of endometriosis implants, and lysis of adhesions.  SURGEON:  Monia Sabal. Corinna Capra, MD  ASSISTANT:  Darlyn Chamber, MD  ANESTHESIA:  General endotracheal.  INDICATIONS:  Christina Kelly is a 48 year old menopausal white female, who has a history of leukemia currently in remission, has had intermittent pain with history of endometriosis in the past.  She also has bilateral recurrent ovarian cyst, and is being followed on ultrasounds recently for this with intermittent pelvic pain.  She desires definitive surgical intervention and requests removal of both ovaries, endometriosis implant, wants to proceed with hysterectomy as well.  We discussed the risks and benefits of the procedure at length, which include, but not limited to risk of infection, bleeding, damage to bowel, bladder, ureters, or possibly may not alleviate the pain, it could recur or worsen.  She does give her informed consent and wished to proceed.  See history and physical for further details.  FINDINGS AT THE TIME OF SURGERY:  Normal-appearing liver.  Appendix is retrocecal.  Uterus is normal in appearance.  There is a small right ovarian cyst suspicious for endometrioma.  The left ovary has adhesions to the fallopian tube and pelvic sidewall.  Small area of the left uterosacral ligament consistent with a  Master window, otherwise normal- appearing bladder.  DESCRIPTION OF PROCEDURE:  After adequate analgesia, the patient placed in a dorsal supine position.  She was sterilely prepped and draped.  The bladder sterilely drained.  Hulka tenaculum placed on the cervix.  A 1 cm infraumbilical skin incision was made.  Veress needle was inserted. The abdomen was insufflated with dullness to percussion.  An 11-mm trocar was inserted.  The above findings were noted.  The 5-mm trocar was inserted left to the midline 2 fingerbreadths above the pubic symphysis under direct visualization.  After careful evaluation of the abdomen and pelvis, a LigaSure instrument was used to ligate across the right utero-ovarian ligament down across the round ligament into the broad ligament with the right tube and ovary falling towards the uterus with good hemostasis achieved.  Care was taken to avoid the underlying ureter.  The left tubo-ovarian complex was elevated and LigaSure was then used to ligate the adhesions to the pelvic sidewall and the right ovary was dissected from the pelvic sidewall as well, freeing this up from pelvic sidewall.  The utero-ovarian ligament was then ligated and dissected down across the round ligament to the inferior portion of the broad ligament with the left tube and ovary now following towards the uterus.  Re-evaluation of the left pelvic sidewall reveals no residual ovary.  From the pelvic sidewall,  the residual endometriosis was noted. Dissection was well away from the ureter and good peristalsis was noted. Good hemostasis had been achieved.  The uterosacral ligaments was then grasped and desiccated using the bipolar cautery.  With good thermal cautery effect no residual endometriosis noted.  The bladder was then grasped and elevated and the uterovesical junction was noted.  The bladder flap was then created and was then desufflated.  The legs were repositioned.  A weighted speculum  placed in vagina.  Posterior colpotomy was performed.  The uterus was circumscribed with Bovie cautery.  LigaSure was used to ligate across the uterosacral ligaments bilaterally, cardinal ligaments bilaterally, bladder pillars bilaterally.  The anterior vaginal mucosa was dissected off the cervix, and the anterior peritoneum was entered, and a Deaver retractor placed underneath the bladder.  The uterosacral ligaments were then ligated with LigaSure instrument.  The broad ligaments were ligated.  The uterus, tubes, and ovaries were removed.  Good hemostasis had been achieved.  The uterosacral ligaments were then identified.  Figure-of- eights of 0 Monocryl suture were used to ligate each uterosacral ligament.  Posterior peritoneum was then closed in a pursestring fashion using 0 Monocryl suture.  The uterosacral ligaments were plicated in midline using a figure-of-eight 0 Monocryl suture.  The vaginal mucosa was then closed in a vertical fashion using figure-of-eights of 0 Monocryl suture with good approximation and hemostasis was achieved.  A small laceration along the left apex from the retractor was then ligated with figure-of-eight of 0 Monocryl suture with good approximation.  Good hemostasis noted.  Foley catheter was placed with return of clear yellow urine.  Legs were repositioned, re-insufflated.  Careful systematic evaluation of the pelvis revealed good hemostasis of all the pedicles. The cuff was appropriately closed and no active bleeding was noted. Small peritoneal edges were cauterized with bipolar cautery, and after adequate hemostasis was achieved, bilateral ureters showed good peristalsis.  The abdomen was then desufflated.  Trocars removed.  The infraumbilical skin incision was closed with 0 Vicryl interrupted suture, the fascia with 3-0 Vicryl Rapide subcuticular suture.  The 5-mm site was closed with 3-0 Vicryl Rapide subcuticular suture and Dermabond.  The incisions  were injected with 0.25% Marcaine, total 10 mL used.  The patient was stable and transferred to recovery room.  Sponge and needle count was normal x3.  ESTIMATED BLOOD LOSS:  75 mL.  The patient received 2 g of cefotetan preoperatively.  DISPOSITION:  The patient will be kept overnight for observation for pain management and fluid management and was stable and transferred to recovery room.     Monia Sabal Corinna Capra, M.D.     DCL/MEDQ  D:  12/31/2013  T:  12/31/2013  Job:  [id number]

## 2013-12-31 NOTE — Progress Notes (Signed)
Patient ID: Christina Kelly, female   DOB: December 24, 1965, 48 y.o.   MRN: [id number] Pts doing great Wants foley out due to discomfort Abd nd, bandage dry  Post op Reviewed case findings Will remove foley and advance diet DL

## 2013-12-31 NOTE — Brief Op Note (Signed)
12/31/2013  8:55 AM  PATIENT:  Christina Kelly  48 y.o. female   PRE-OPERATIVE DIAGNOSIS:  bilateral ovarian cysts, endometriosis, pelvic pain  POST-OPERATIVE DIAGNOSIS:  bilateral ovarian cysts, endometriosis, pelvic pain  PROCEDURE:  Procedure(s): LAPAROSCOPIC ASSISTED VAGINAL HYSTERECTOMY (N/A) SALPINGO OOPHORECTOMY (Bilateral)  SURGEON:  Surgeon(s) and Role:    * Luz Lex, MD - Primary    * Darlyn Chamber, MD - Assisting  PHYSICIAN ASSISTANT:   ASSISTANTS: mcComb   ANESTHESIA:   general  EBL:  Total I/O In: 400 [I.V.:400] Out: 175 [Urine:100; Blood:75]  BLOOD ADMINISTERED:none  DRAINS: Urinary Catheter (Foley)   LOCAL MEDICATIONS USED:  MARCAINE     SPECIMEN:  Source of Specimen:  uterus, tubes and ovaries  DISPOSITION OF SPECIMEN:  PATHOLOGY  COUNTS:  YES  TOURNIQUET:  * No tourniquets in log *  DICTATION: .Other Dictation: Dictation Number 1  PLAN OF CARE: Admit for overnight observation  PATIENT DISPOSITION:  PACU - hemodynamically stable.   Delay start of Pharmacological VTE agent (>24hrs) due to surgical blood loss or risk of bleeding: not applicable

## 2013-12-31 NOTE — Transfer of Care (Signed)
Immediate Anesthesia Transfer of Care Note  Patient: Christina Kelly  Procedure(s) Performed: Procedure(s): LAPAROSCOPIC ASSISTED VAGINAL HYSTERECTOMY (N/A) SALPINGO OOPHORECTOMY (Bilateral)  Patient Location: PACU  Anesthesia Type:General  Level of Consciousness: sedated  Airway & Oxygen Therapy: Patient Spontanous Breathing and Patient connected to nasal cannula oxygen  Post-op Assessment: Report given to PACU RN and Post -op Vital signs reviewed and stable  Post vital signs: stable  Complications: No apparent anesthesia complications

## 2014-01-01 ENCOUNTER — Encounter (HOSPITAL_COMMUNITY): Payer: Self-pay | Admitting: Obstetrics and Gynecology

## 2014-01-01 MED ORDER — OXYCODONE-ACETAMINOPHEN 5-325 MG PO TABS: 1.0000 | ORAL_TABLET | ORAL | Status: DC | PRN

## 2014-01-01 NOTE — Discharge Summary (Signed)
Physician Discharge Summary  Patient ID: Christina Kelly MRN: [id number] DOB/AGE: April 30, 1966 48 y.o.  Admit date: 12/31/2013 Discharge date: 01/01/2014  Admission Diagnoses:pelvic pain, endometriosis, recurrent ovarian cysts  Discharge Diagnoses: same Active Problems:   S/P laparoscopic assisted vaginal hysterectomy (LAVH)   Discharged Condition: good  Hospital Course: pt underwent uncomplicated LAVH, BSO, LOA and ablation of endometriosis.  EBL 75cc.  Her post op course was unremarkable with quick return of bowel and bladder function.  Post op day #1 was tolerating regular diet, pain well controlled on oral meds, and voiding without difficulty.  Consults: None  Significant Diagnostic Studies: labs: hgb 12.0  Treatments: surgery: as above  Discharge Exam: Blood pressure 96/58, pulse 63, temperature 98.2 F (36.8 C), temperature source Oral, resp. rate 14, height 5' 11.5" (1.816 m), weight 73.936 kg (163 lb), SpO2 98.00%. General appearance: alert, cooperative, appears stated age and no distress GI: soft, non-tender; bowel sounds normal; no masses,  no organomegaly Incision/Wound:CD&I  Disposition: Final discharge disposition not confirmed  Discharge Orders   Future Appointments Provider Department Dept Phone   03/19/2014 9:30 AM [place] [phone]   03/19/2014 10:00 AM [name], MD [place] [phone]   Future Orders Complete By Expires   Call MD for:  difficulty breathing, headache or visual disturbances  As directed    Call MD for:  persistant nausea and vomiting  As directed    Call MD for:  redness, tenderness, or signs of infection (pain, swelling, redness, odor or green/yellow discharge around incision site)  As directed    Call MD for:  severe uncontrolled pain  As directed    Call MD for:  temperature >100.4  As directed    Diet general  As directed    Driving Restrictions   As directed    Comments:     No driving for 1 week   Increase activity slowly  As directed    Lifting restrictions  As directed    Comments:     No lifting anything greater than 10 pounds (if you have to ask, don't lift it)   Sexual Activity Restrictions  As directed    Comments:     Nothing in the vagina for 6 weeks       Medication List         dasatinib 70 MG tablet  Commonly known as:  SPRYCEL  Take 1 tablet (70 mg total) by mouth daily.     fexofenadine-pseudoephedrine 180-240 MG per 24 hr tablet  Commonly known as:  ALLEGRA-D 24  Take 1 tablet by mouth daily as needed (for allergies).     multivitamin with minerals Tabs tablet  Take 1 tablet by mouth daily.     oxyCODONE-acetaminophen 5-325 MG per tablet  Commonly known as:  PERCOCET/ROXICET  Take 1-2 tablets by mouth every 4 (four) hours as needed for severe pain (moderate to severe pain (when tolerating fluids)).     triamcinolone 0.1 % cream : eucerin Crea  Apply 1 application topically daily as needed. For eczema     XANAX 0.25 MG tablet  Generic drug:  ALPRAZolam  Take 0.125 mg by mouth at bedtime as needed for sleep.         Signed: Luz Lex 01/01/2014, 8:39 AM

## 2014-01-01 NOTE — Progress Notes (Signed)
Pt is discharged in the care of friend. Downstairs per ambulatory eith R.N. Escort. Pt has discharged instructions with Rx .Understands all instructions well..Questions were asked and answered. Abdominal incisions are clean and dry with dermabond. Scant amt of vaginal nored on Vpad.

## 2014-01-01 NOTE — Anesthesia Postprocedure Evaluation (Signed)
  Anesthesia Post-op Note  Patient: Christina Kelly  Procedure(s) Performed: Procedure(s): LAPAROSCOPIC ASSISTED VAGINAL HYSTERECTOMY (N/A) SALPINGO OOPHORECTOMY (Bilateral)  Patient Location: [facility]  Anesthesia Type:General  Level of Consciousness: awake and alert   Airway and Oxygen Therapy: Patient Spontanous Breathing  Post-op Pain: mild  Post-op Assessment: Patient's Cardiovascular Status Stable, Respiratory Function Stable, No signs of Nausea or vomiting, Pain level controlled, No headache, No residual numbness and No residual motor weakness  Post-op Vital Signs: stable  Complications: No apparent anesthesia complications

## 2014-01-01 NOTE — Addendum Note (Signed)
Addendum created 01/01/14 0759 by Garner Nash, CRNA   Modules edited: Notes Section   Notes Section:  File: [id number]

## 2014-01-25 ENCOUNTER — Encounter: Payer: Self-pay | Admitting: Nurse Practitioner

## 2014-01-25 NOTE — Progress Notes (Signed)
Pt called and stated she recently had a hysterectomy. Today she noticed some irritation near were the IV site was and some redness. Denied any pain. Pt was instructed to follow up with surgeon due to the irritation is around the IV site that she had placed during her surgery. Pt verbalized understanding and will contact our office if further assistance is needed.

## 2014-03-19 ENCOUNTER — Ambulatory Visit (HOSPITAL_BASED_OUTPATIENT_CLINIC_OR_DEPARTMENT_OTHER): Payer: BC Managed Care – PPO | Admitting: Hematology & Oncology

## 2014-03-19 ENCOUNTER — Telehealth: Payer: Self-pay | Admitting: Hematology & Oncology

## 2014-03-19 ENCOUNTER — Other Ambulatory Visit (HOSPITAL_BASED_OUTPATIENT_CLINIC_OR_DEPARTMENT_OTHER): Payer: BC Managed Care – PPO | Admitting: Lab

## 2014-03-19 ENCOUNTER — Encounter: Payer: Self-pay | Admitting: Hematology & Oncology

## 2014-03-19 VITALS — BP 102/71 | HR 71 | Temp 98.1°F | Resp 14 | Ht 70.0 in | Wt 165.0 lb

## 2014-03-19 DIAGNOSIS — C9211 Chronic myeloid leukemia, BCR/ABL-positive, in remission: Secondary | ICD-10-CM

## 2014-03-19 DIAGNOSIS — C9210 Chronic myeloid leukemia, BCR/ABL-positive, not having achieved remission: Secondary | ICD-10-CM

## 2014-03-19 LAB — COMPREHENSIVE METABOLIC PANEL
ALBUMIN: 4.5 g/dL (ref 3.5–5.2)
ALK PHOS: 59 U/L (ref 39–117)
ALT: 23 U/L (ref 0–35)
AST: 20 U/L (ref 0–37)
BUN: 14 mg/dL (ref 6–23)
CALCIUM: 9.9 mg/dL (ref 8.4–10.5)
CHLORIDE: 103 meq/L (ref 96–112)
CO2: 27 mEq/L (ref 19–32)
Creatinine, Ser: 0.91 mg/dL (ref 0.50–1.10)
GLUCOSE: 93 mg/dL (ref 70–99)
Potassium: 4.2 mEq/L (ref 3.5–5.3)
Sodium: 141 mEq/L (ref 135–145)
Total Bilirubin: 0.3 mg/dL (ref 0.2–1.2)
Total Protein: 7.3 g/dL (ref 6.0–8.3)

## 2014-03-19 LAB — CBC WITH DIFFERENTIAL (CANCER CENTER ONLY)
BASO#: 0.1 10*3/uL (ref 0.0–0.2)
BASO%: 0.7 % (ref 0.0–2.0)
EOS%: 13.9 % — ABNORMAL HIGH (ref 0.0–7.0)
Eosinophils Absolute: 1.1 10*3/uL — ABNORMAL HIGH (ref 0.0–0.5)
HEMATOCRIT: 39.8 % (ref 34.8–46.6)
HEMOGLOBIN: 13.1 g/dL (ref 11.6–15.9)
LYMPH#: 2.9 10*3/uL (ref 0.9–3.3)
LYMPH%: 38 % (ref 14.0–48.0)
MCH: 31.6 pg (ref 26.0–34.0)
MCHC: 32.9 g/dL (ref 32.0–36.0)
MCV: 96 fL (ref 81–101)
MONO#: 0.5 10*3/uL (ref 0.1–0.9)
MONO%: 7.1 % (ref 0.0–13.0)
NEUT#: 3.1 10*3/uL (ref 1.5–6.5)
NEUT%: 40.3 % (ref 39.6–80.0)
Platelets: 247 10*3/uL (ref 145–400)
RBC: 4.15 10*6/uL (ref 3.70–5.32)
RDW: 13.3 % (ref 11.1–15.7)
WBC: 7.6 10*3/uL (ref 3.9–10.0)

## 2014-03-19 NOTE — Telephone Encounter (Signed)
Pt aware of BMBX 6-2 at [facility] to be NPO after midnight and she needs a driver to take her home.

## 2014-03-20 NOTE — Progress Notes (Signed)
Hematology and Oncology Follow Up Visit  Christina Kelly [id number] [birth year] 48 y.o. 03/20/2014   Principle Diagnosis:   Chronic phase CML- major molecular remission  Current Therapy:    Sprycel 70 mg by mouth daily     Interim History:  Christina Kelly is back for followup. We last saw her, she did have a hysterectomy. This went well. This was done in February. She had no complications from this.  She's done well with Sprycel. Her last like analysis did not show any BCR/ABL abnormalities. As such, she continues any major molecular remission.  She's had no nausea vomiting. She has some occasional swelling of her joints. I told her her she could try a diuretic if necessary.  She's had no rashes. She's had no change in bowel or bladder habits. There may be occasional diarrhea.  Medications: Current outpatient prescriptions:ALPRAZolam (XANAX) 0.25 MG tablet, Take 0.125 mg by mouth at bedtime as needed for sleep. , Disp: , Rfl: ;  amoxicillin-clavulanate (AUGMENTIN) 875-125 MG per tablet, Take 1 tablet by mouth 2 (two) times daily. , Disp: , Rfl: ;  dasatinib (SPRYCEL) 70 MG tablet, Take 1 tablet (70 mg total) by mouth daily., Disp: 30 tablet, Rfl: 3 fexofenadine-pseudoephedrine (ALLEGRA-D 24) 180-240 MG per 24 hr tablet, Take 1 tablet by mouth daily as needed (for allergies)., Disp: , Rfl: ;  fluticasone (FLONASE) 50 MCG/ACT nasal spray, Place 1 spray into both nostrils daily., Disp: , Rfl: ;  Multiple Vitamin (MULTIVITAMIN WITH MINERALS) TABS, Take 1 tablet by mouth daily., Disp: , Rfl:  Triamcinolone Acetonide (TRIAMCINOLONE 0.1 % CREAM : EUCERIN) CREA, Apply 1 application topically daily as needed. For eczema, Disp: , Rfl: ;  valACYclovir (VALTREX) 500 MG tablet, Take 500 mg by mouth as needed. , Disp: , Rfl:   Allergies:  Allergies  Allergen Reactions  . Ciprofloxacin     rash  . Levaquin [Levofloxacin]     rash    Past Medical History, Surgical history, Social history, and  Family History were reviewed and updated.  Review of Systems: As above  Physical Exam:  height is 5\' 10"  (1.778 m) and weight is 165 lb (74.844 kg). Her oral temperature is 98.1 F (36.7 C). Her blood pressure is 102/71 and her pulse is 71. Her respiration is 14.   Well-developed and well-nourished white female. Lungs are clear. Cardiac exam regular in rhythm. Abdomen soft. No palpable liver or spleen tip. Back exam no tenderness over the spine ribs or hips. Extremities shows no clubbing cyanosis or edema. She has well-healed laparoscopy scars.  Lab Results  Component Value Date   WBC 7.6 03/19/2014   HGB 13.1 03/19/2014   HCT 39.8 03/19/2014   MCV 96 03/19/2014   PLT 247 03/19/2014     Chemistry      Component Value Date/Time   NA 141 03/19/2014 0938   NA 139 04/15/2013 0949   K 4.2 03/19/2014 0938   K 5.3* 04/15/2013 0949   CL 103 03/19/2014 0938   CL 102 04/15/2013 0949   CO2 27 03/19/2014 0938   CO2 29 04/15/2013 0949   BUN 14 03/19/2014 0938   BUN 11 04/15/2013 0949   CREATININE 0.91 03/19/2014 0938   CREATININE 0.7 04/15/2013 0949      Component Value Date/Time   CALCIUM 9.9 03/19/2014 0938   CALCIUM 8.9 04/15/2013 0949   ALKPHOS 59 03/19/2014 0938   AST 20 03/19/2014 0938   ALT 23 03/19/2014 0938   BILITOT 0.3 03/19/2014  0938         Impression and Plan: Christina Kelly is 48 year old white female with CML. She is on Sprycel. She is in chronic phase. She has had major molecular remission for 9 months. She is doing quite well.  It would be time to do a followup bone marrow test on her. We will get this set up likely for June. She will let us know when she can do an. She will call back for this.  We will continue the Sprycel. I do not see any need for any additional studies.  We will plan to get her back in another 4 months.   [name], MD 5/16/201512:15 PM

## 2014-03-25 ENCOUNTER — Telehealth: Payer: Self-pay | Admitting: *Deleted

## 2014-03-25 NOTE — Telephone Encounter (Addendum)
Message copied by Lenn Sink on Thu Mar 25, 2014 10:19 AM ------      Message from: Burney Gauze R      Created: Wed Mar 24, 2014  6:37 PM       Please call and let her know that the chronic leukemia is still in remission. This is a fantastic. Thanks. Pete ------Informed pt that chronic leukemia is still in remission!

## 2014-04-01 ENCOUNTER — Encounter (HOSPITAL_COMMUNITY): Payer: Self-pay | Admitting: Pharmacy Technician

## 2014-04-03 NOTE — Addendum Note (Signed)
Addended by: Burney Gauze R on: 04/03/2014 07:44 AM   Modules accepted: Orders

## 2014-04-06 ENCOUNTER — Encounter (HOSPITAL_COMMUNITY): Payer: Self-pay

## 2014-04-06 ENCOUNTER — Ambulatory Visit (HOSPITAL_COMMUNITY)
Admission: RE | Admit: 2014-04-06 | Discharge: 2014-04-06 | Disposition: A | Discharge status: Home / Self Care | Payer: BC Managed Care – PPO | Source: Ambulatory Visit | Attending: Hematology & Oncology | Admitting: Hematology & Oncology

## 2014-04-06 VITALS — BP 99/66 | HR 62 | Temp 97.8°F | Resp 18 | Ht 71.5 in | Wt 165.0 lb

## 2014-04-06 DIAGNOSIS — D704 Cyclic neutropenia: Secondary | ICD-10-CM | POA: Insufficient documentation

## 2014-04-06 DIAGNOSIS — C9210 Chronic myeloid leukemia, BCR/ABL-positive, not having achieved remission: Secondary | ICD-10-CM | POA: Insufficient documentation

## 2014-04-06 DIAGNOSIS — C9211 Chronic myeloid leukemia, BCR/ABL-positive, in remission: Secondary | ICD-10-CM

## 2014-04-06 LAB — CBC
HEMATOCRIT: 37.4 % (ref 36.0–46.0)
Hemoglobin: 12.4 g/dL (ref 12.0–15.0)
MCH: 31.3 pg (ref 26.0–34.0)
MCHC: 33.2 g/dL (ref 30.0–36.0)
MCV: 94.4 fL (ref 78.0–100.0)
Platelets: 228 10*3/uL (ref 150–400)
RBC: 3.96 MIL/uL (ref 3.87–5.11)
RDW: 13.8 % (ref 11.5–15.5)
WBC: 6.8 10*3/uL (ref 4.0–10.5)

## 2014-04-06 LAB — BONE MARROW EXAM

## 2014-04-06 MED ORDER — MIDAZOLAM HCL 10 MG/2ML IJ SOLN
10.0000 mg | Freq: Once | INTRAMUSCULAR | Status: DC
  Filled 2014-04-06: qty 2

## 2014-04-06 MED ORDER — SODIUM CHLORIDE 0.9 % IV SOLN
Freq: Once | INTRAVENOUS | Status: AC
  Administered 2014-04-06: 07:00:00 via INTRAVENOUS

## 2014-04-06 MED ORDER — MEPERIDINE HCL 50 MG/ML IJ SOLN
50.0000 mg | Freq: Once | INTRAMUSCULAR | Status: DC
  Filled 2014-04-06: qty 1

## 2014-04-06 MED ORDER — MIDAZOLAM HCL 2 MG/2ML IJ SOLN
INTRAMUSCULAR | Status: AC | PRN
  Administered 2014-04-06: 5 mg via INTRAVENOUS
  Administered 2014-04-06: 1 mg via INTRAVENOUS

## 2014-04-06 MED ORDER — MEPERIDINE HCL 25 MG/ML IJ SOLN
INTRAMUSCULAR | Status: AC | PRN
  Administered 2014-04-06 (×2): 25 mg via INTRAVENOUS

## 2014-04-06 NOTE — Progress Notes (Signed)
This is a bone marrow biopsy and aspirate note. Christina Kelly was brought to the short stay unit at [facility]. In she had an IV placed peripherally. This is in her right arm.  We did the appropriate timeout procedure.  Her ASA class was 1. Her Malimp[ati score was 1.  She was placed onto her right side. To see the total of 6 mg of Versed and 50 mg Demerol for IV sedation.  The left posterior crest region was prepped and draped in sterile fashion. 5 cc of 2% lidocaine was infiltrated under the skin down to the periosteum.  A scalpel was used to make an incision into the skin.  We is the combination aspirate and biopsy needle. We obtained 2 aspirates  without difficulty.  The biopsy needle was further advanced through the bone marrow core. We obtained an excellent specimen..  She tolerated the procedure well. We cleaned and dressed the procedure site sterilely. There were no complications.

## 2014-04-06 NOTE — Discharge Instructions (Signed)
Conscious Sedation, Adult, Care After Refer to this sheet in the next few weeks. These instructions provide you with information on caring for yourself after your procedure. Your health care provider may also give you more specific instructions. Your treatment has been planned according to current medical practices, but problems sometimes occur. Call your health care provider if you have any problems or questions after your procedure. WHAT TO EXPECT AFTER THE PROCEDURE  After your procedure:  You may feel sleepy, clumsy, and have poor balance for several hours.  Vomiting may occur if you eat too soon after the procedure. HOME CARE INSTRUCTIONS  Do not participate in any activities where you could become injured for at least 24 hours. Do not:  Drive.  Swim.  Ride a bicycle.  Operate heavy machinery.  Cook.  Use power tools.  Climb ladders.  Work from a high place.  Do not make important decisions or sign legal documents until you are improved.  If you vomit, drink water, juice, or soup when you can drink without vomiting. Make sure you have little or no nausea before eating solid foods.  Only take over-the-counter or prescription medicines for pain, discomfort, or fever as directed by your health care provider.  Make sure you and your family fully understand everything about the medicines given to you, including what side effects may occur.  You should not drink alcohol, take sleeping pills, or take medicines that cause drowsiness for at least 24 hours.  If you smoke, do not smoke without supervision.  If you are feeling better, you may resume normal activities 24 hours after you were sedated.  Keep all appointments with your health care provider. SEEK MEDICAL CARE IF:  Your skin is pale or bluish in color.  You continue to feel nauseous or vomit.  Your pain is getting worse and is not helped by medicine.  You have bleeding or swelling.  You are still sleepy or  feeling clumsy after 24 hours. SEEK IMMEDIATE MEDICAL CARE IF:  You develop a rash.  You have difficulty breathing.  You develop any type of allergic problem.  You have a fever. MAKE SURE YOU:  Understand these instructions.  Will watch your condition.  Will get help right away if you are not doing well or get worse. Document Released: 08/12/2013 Document Reviewed: 05/29/2013 [facility] Patient Information 2014 [place], [place]. Bone Marrow Aspiration, Bone Marrow Biopsy Care After Read the instructions outlined below and refer to this sheet in the next few weeks. These discharge instructions provide you with general information on caring for yourself after you leave the hospital. Your caregiver may also give you specific instructions. While your treatment has been planned according to the most current medical practices available, unavoidable complications occasionally occur. If you have any problems or questions after discharge, call your caregiver. FINDING OUT THE RESULTS OF YOUR TEST Not all test results are available during your visit. If your test results are not back during the visit, make an appointment with your caregiver to find out the results. Do not assume everything is normal if you have not heard from your caregiver or the medical facility. It is important for you to follow up on all of your test results.  HOME CARE INSTRUCTIONS  You have had sedation and may be sleepy or dizzy. Your thinking may not be as clear as usual. For the next 24 hours:  Only take over-the-counter or prescription medicines for pain, discomfort, and or fever as directed by your caregiver.  Do not drink alcohol.  Do not smoke.  Do not drive.  Do not make important legal decisions.  Do not operate heavy machinery.  Do not care for small children by yourself.  Keep your dressing clean and dry. You may replace dressing with a bandage after 24 hours.  You may take a bath or shower after 24  hours.  Use an ice pack for 20 minutes every 2 hours while awake for pain as needed. SEEK MEDICAL CARE IF:   There is redness, swelling, or increasing pain at the biopsy site.  There is pus coming from the biopsy site.  There is drainage from a biopsy site lasting longer than one day.  An unexplained oral temperature above 102 F (38.9 C) develops. SEEK IMMEDIATE MEDICAL CARE IF:   You develop a rash.  You have difficulty breathing.  You develop any reaction or side effects to medications given. Document Released: 05/11/2005 Document Revised: 01/14/2012 Document Reviewed: 10/19/2008 [facility] Patient Information 2014 [place].

## 2014-04-12 LAB — TISSUE HYBRIDIZATION (BONE MARROW)-NCBH

## 2014-04-12 LAB — CHROMOSOME ANALYSIS, BONE MARROW

## 2014-04-23 ENCOUNTER — Encounter: Payer: Self-pay | Admitting: Hematology & Oncology

## 2014-07-09 ENCOUNTER — Encounter: Payer: Self-pay | Admitting: Hematology & Oncology

## 2014-07-09 ENCOUNTER — Other Ambulatory Visit (HOSPITAL_BASED_OUTPATIENT_CLINIC_OR_DEPARTMENT_OTHER): Payer: BC Managed Care – PPO | Admitting: Lab

## 2014-07-09 ENCOUNTER — Ambulatory Visit (HOSPITAL_BASED_OUTPATIENT_CLINIC_OR_DEPARTMENT_OTHER): Payer: BC Managed Care – PPO | Admitting: Hematology & Oncology

## 2014-07-09 VITALS — BP 102/77 | HR 51 | Temp 98.2°F | Resp 14

## 2014-07-09 DIAGNOSIS — B009 Herpesviral infection, unspecified: Secondary | ICD-10-CM

## 2014-07-09 DIAGNOSIS — C9211 Chronic myeloid leukemia, BCR/ABL-positive, in remission: Secondary | ICD-10-CM

## 2014-07-09 DIAGNOSIS — C9210 Chronic myeloid leukemia, BCR/ABL-positive, not having achieved remission: Secondary | ICD-10-CM

## 2014-07-09 LAB — CBC WITH DIFFERENTIAL (CANCER CENTER ONLY)
BASO#: 0 10*3/uL (ref 0.0–0.2)
BASO%: 0.2 % (ref 0.0–2.0)
EOS ABS: 0.2 10*3/uL (ref 0.0–0.5)
EOS%: 3.2 % (ref 0.0–7.0)
HEMATOCRIT: 39.4 % (ref 34.8–46.6)
HEMOGLOBIN: 12.9 g/dL (ref 11.6–15.9)
LYMPH#: 2.5 10*3/uL (ref 0.9–3.3)
LYMPH%: 51.2 % — ABNORMAL HIGH (ref 14.0–48.0)
MCH: 31.6 pg (ref 26.0–34.0)
MCHC: 32.7 g/dL (ref 32.0–36.0)
MCV: 97 fL (ref 81–101)
MONO#: 0.3 10*3/uL (ref 0.1–0.9)
MONO%: 5.6 % (ref 0.0–13.0)
NEUT%: 39.8 % (ref 39.6–80.0)
NEUTROS ABS: 2 10*3/uL (ref 1.5–6.5)
Platelets: 211 10*3/uL (ref 145–400)
RBC: 4.08 10*6/uL (ref 3.70–5.32)
RDW: 13.7 % (ref 11.1–15.7)
WBC: 5 10*3/uL (ref 3.9–10.0)

## 2014-07-09 LAB — CMP (CANCER CENTER ONLY)
ALT: 20 U/L (ref 10–47)
AST: 22 U/L (ref 11–38)
Albumin: 4.2 g/dL (ref 3.3–5.5)
Alkaline Phosphatase: 45 U/L (ref 26–84)
BUN, Bld: 13 mg/dL (ref 7–22)
CO2: 29 mEq/L (ref 18–33)
Calcium: 9.7 mg/dL (ref 8.0–10.3)
Chloride: 101 mEq/L (ref 98–108)
Creat: 0.8 mg/dl (ref 0.6–1.2)
Glucose, Bld: 97 mg/dL (ref 73–118)
Potassium: 4.2 mEq/L (ref 3.3–4.7)
Sodium: 143 mEq/L (ref 128–145)
TOTAL PROTEIN: 7.7 g/dL (ref 6.4–8.1)
Total Bilirubin: 0.7 mg/dl (ref 0.20–1.60)

## 2014-07-09 LAB — CHCC SATELLITE - SMEAR

## 2014-07-09 LAB — MAGNESIUM: Magnesium: 2.3 mg/dL (ref 1.5–2.5)

## 2014-07-09 MED ORDER — VALACYCLOVIR HCL 500 MG PO TABS: 500.0000 mg | ORAL_TABLET | ORAL | Status: DC | PRN

## 2014-07-09 NOTE — Progress Notes (Signed)
Hematology and Oncology Follow Up Visit  Christina Kelly [id number] [birth year] 48 y.o. 07/09/2014   Principle Diagnosis:   Chronic phase CML  Current Therapy:    Sprycel 70 mg by mouth daily     Interim History:  [name] is back for followup. We did go ahead and do a bone marrow test on her back in June. The bone marrow report ([id number]) show that she had a slight hypocellular marrow with normal hematopoiesis. She was negative for the [place] chromosome.  Her BCR/ABL is non-detectable. As such, she is in a complete molecular remission.  She's doing okay. She little bit of a sore throat. Her son is getting through a cold. He got this when he started school this year.  Otherwise she would do well. She a good summer. She went on vacation. She went down to [place]. She had  a good time.  She's had no rashes. She's had no problems with bowels or bladder. She does does not have any further monthly cycles because of a hysterectomy.  She's had no problems with bowels or bladder.  Medications: Current outpatient prescriptions:ALPRAZolam (XANAX) 0.25 MG tablet, Take 0.125 mg by mouth at bedtime as needed for sleep., Disp: , Rfl: ;  dasatinib (SPRYCEL) 70 MG tablet, Take 1 tablet (70 mg total) by mouth daily., Disp: 30 tablet, Rfl: 3;  fexofenadine-pseudoephedrine (ALLEGRA-D 24) 180-240 MG per 24 hr tablet, Take 1 tablet by mouth daily as needed (for allergies)., Disp: , Rfl:  fluticasone (FLONASE) 50 MCG/ACT nasal spray, Place 1 spray into both nostrils daily., Disp: , Rfl: ;  ibuprofen (ADVIL,MOTRIN) 200 MG tablet, Take 200 mg by mouth as needed., Disp: , Rfl: ;  Multiple Vitamin (MULTIVITAMIN WITH MINERALS) TABS, Take 1 tablet by mouth daily., Disp: , Rfl: ;  Triamcinolone Acetonide (TRIAMCINOLONE 0.1 % CREAM : EUCERIN) CREA, Apply 1 application topically daily as needed. For eczema, Disp: , Rfl:  valACYclovir (VALTREX) 500 MG tablet, Take 1 tablet (500 mg total) by mouth as needed  (Herpes Virus)., Disp: 90 tablet, Rfl: 3  Allergies:  Allergies  Allergen Reactions  . Ciprofloxacin     rash  . Levaquin [Levofloxacin]     rash    Past Medical History, Surgical history, Social history, and Family History were reviewed and updated.  Review of Systems: As above  Physical Exam:  oral temperature is 98.2 F (36.8 C). Her blood pressure is 102/77 and her pulse is 51. Her respiration is 14.   Well-developed and well-nourished white female. Head and neck exam shows no ocular or oral lesions. She has no adenopathy. There is some slight pharyngeal erythema. Lungs are clear. Cardiac exam regular rate and rhythm with no murmurs rubs or bruits. Abdomen is soft. There are no fluid wave. There is no palpable liver or spleen tip. Back exam shows no tenderness over the spine, ribs or hips. Extremities shows no clubbing, cyanosis or edema. Neurological exam is nonfocal. Skin exam shows no rashes, ecchymoses or petechia.  Lab Results  Component Value Date   WBC 5.0 07/09/2014   HGB 12.9 07/09/2014   HCT 39.4 07/09/2014   MCV 97 07/09/2014   PLT 211 07/09/2014     Chemistry      Component Value Date/Time   NA 143 07/09/2014 0833   NA 141 03/19/2014 0938   K 4.2 07/09/2014 0833   K 4.2 03/19/2014 0938   CL 101 07/09/2014 0833   CL 103 03/19/2014 0938   CO2 29 07/09/2014  0833   CO2 27 03/19/2014 0938   BUN 13 07/09/2014 0833   BUN 14 03/19/2014 0938   CREATININE 0.8 07/09/2014 0833   CREATININE 0.91 03/19/2014 0938      Component Value Date/Time   CALCIUM 9.7 07/09/2014 0833   CALCIUM 9.9 03/19/2014 0938   ALKPHOS 45 07/09/2014 0833   ALKPHOS 59 03/19/2014 0938   AST 22 07/09/2014 0833   AST 20 03/19/2014 0938   ALT 20 07/09/2014 0833   ALT 23 03/19/2014 0938   BILITOT 0.70 07/09/2014 0833   BILITOT 0.3 03/19/2014 0938         Impression and Plan: Christina Kelly is a 47 year old female with chronic phase CML. She was diagnosed back in May of last year. She's been a major molecular remission.  We  will continue her on the Sprycel. She wants to try to cut the dose back a little bit. We'll see her back in 4 months, we will see about decreasing her dose a little bit.  Again, she is any major molecular remission.Marland Kitchen Hopefully, we will be a will to get her off the Sprycel at some point in the future. We will await the clinical trials that are looking at stopping the tyrosine kinase inhibitor therapy.   [name], MD 9/4/201510:02 AM

## 2014-07-20 ENCOUNTER — Encounter: Payer: Self-pay | Admitting: *Deleted

## 2014-09-03 ENCOUNTER — Other Ambulatory Visit: Payer: Self-pay | Admitting: *Deleted

## 2014-09-03 DIAGNOSIS — C9210 Chronic myeloid leukemia, BCR/ABL-positive, not having achieved remission: Secondary | ICD-10-CM

## 2014-09-03 MED ORDER — DASATINIB 70 MG PO TABS: 70.0000 mg | ORAL_TABLET | Freq: Every day | ORAL | Status: DC

## 2014-10-12 ENCOUNTER — Ambulatory Visit (HOSPITAL_BASED_OUTPATIENT_CLINIC_OR_DEPARTMENT_OTHER): Payer: BC Managed Care – PPO | Admitting: Hematology & Oncology

## 2014-10-12 ENCOUNTER — Other Ambulatory Visit: Payer: Self-pay | Admitting: Nurse Practitioner

## 2014-10-12 ENCOUNTER — Encounter: Payer: Self-pay | Admitting: Hematology & Oncology

## 2014-10-12 ENCOUNTER — Other Ambulatory Visit (HOSPITAL_BASED_OUTPATIENT_CLINIC_OR_DEPARTMENT_OTHER): Payer: BC Managed Care – PPO | Admitting: Lab

## 2014-10-12 VITALS — BP 112/51 | HR 63 | Temp 98.0°F | Resp 14 | Ht 70.0 in | Wt 173.0 lb

## 2014-10-12 DIAGNOSIS — B009 Herpesviral infection, unspecified: Secondary | ICD-10-CM

## 2014-10-12 DIAGNOSIS — C9210 Chronic myeloid leukemia, BCR/ABL-positive, not having achieved remission: Secondary | ICD-10-CM

## 2014-10-12 DIAGNOSIS — H9313 Tinnitus, bilateral: Secondary | ICD-10-CM

## 2014-10-12 DIAGNOSIS — H9319 Tinnitus, unspecified ear: Secondary | ICD-10-CM

## 2014-10-12 LAB — CMP (CANCER CENTER ONLY)
ALBUMIN: 3.8 g/dL (ref 3.3–5.5)
ALK PHOS: 57 U/L (ref 26–84)
ALT(SGPT): 18 U/L (ref 10–47)
AST: 19 U/L (ref 11–38)
BUN, Bld: 14 mg/dL (ref 7–22)
CO2: 30 mEq/L (ref 18–33)
Calcium: 9.7 mg/dL (ref 8.0–10.3)
Chloride: 102 mEq/L (ref 98–108)
Creat: 1.1 mg/dl (ref 0.6–1.2)
Glucose, Bld: 51 mg/dL — ABNORMAL LOW (ref 73–118)
POTASSIUM: 4.5 meq/L (ref 3.3–4.7)
Sodium: 143 mEq/L (ref 128–145)
Total Bilirubin: 0.6 mg/dl (ref 0.20–1.60)
Total Protein: 7.6 g/dL (ref 6.4–8.1)

## 2014-10-12 LAB — CBC WITH DIFFERENTIAL (CANCER CENTER ONLY)
BASO#: 0 10*3/uL (ref 0.0–0.2)
BASO%: 0.6 % (ref 0.0–2.0)
EOS%: 4.1 % (ref 0.0–7.0)
Eosinophils Absolute: 0.2 10*3/uL (ref 0.0–0.5)
HCT: 38.9 % (ref 34.8–46.6)
HGB: 12.9 g/dL (ref 11.6–15.9)
LYMPH#: 2.9 10*3/uL (ref 0.9–3.3)
LYMPH%: 53.4 % — ABNORMAL HIGH (ref 14.0–48.0)
MCH: 32.1 pg (ref 26.0–34.0)
MCHC: 33.2 g/dL (ref 32.0–36.0)
MCV: 97 fL (ref 81–101)
MONO#: 0.4 10*3/uL (ref 0.1–0.9)
MONO%: 6.5 % (ref 0.0–13.0)
NEUT#: 1.9 10*3/uL (ref 1.5–6.5)
NEUT%: 35.4 % — ABNORMAL LOW (ref 39.6–80.0)
Platelets: 198 10*3/uL (ref 145–400)
RBC: 4.02 10*6/uL (ref 3.70–5.32)
RDW: 13.2 % (ref 11.1–15.7)
WBC: 5.4 10*3/uL (ref 3.9–10.0)

## 2014-10-12 LAB — CHCC SATELLITE - SMEAR

## 2014-10-12 LAB — MAGNESIUM: Magnesium: 2.1 mg/dL (ref 1.5–2.5)

## 2014-10-12 MED ORDER — DASATINIB 50 MG PO TABS: 50.0000 mg | ORAL_TABLET | Freq: Every day | ORAL | Status: DC

## 2014-10-13 NOTE — Progress Notes (Signed)
Hematology and Oncology Follow Up Visit  Christina Kelly [id number] November 13, 1965 48 y.o. 10/13/2014   Principle Diagnosis:   Chronic phase CML-major blood or remission  Current Therapy:    Sprycel 70 mg by mouth daily.  Dose to be decreased to 50 mg by mouth daily     Interim History:  Christina Kelly is back for followup. She is complaining of tinnitus. She's had this for a few weeks. She's not sure if it is bothering her hearing. There is no nausea or vomiting with it. There is no dizziness.  I told her that Sprycel really should not be the cause of this. Tinnitus with Sprycel is less than 1%. She will Sprycel now for 1 and half years.  We last saw her in September, she was in a major molecular remission. Her BCR/ABL ratio was 0.05%.  Otherwise, she is doing good. She is exercising more. She has was been very athletic.  She has gotten over the hysterectomy. She had this back in February.  She's had no rashes. There's been no leg swelling. She's had no cough. She's had no visual problems.  His been no change in bowel or bladder habits.   Medications: Current outpatient prescriptions: ALPRAZolam (XANAX) 0.25 MG tablet, Take 0.125 mg by mouth at bedtime as needed for sleep., Disp: , Rfl: ;  fexofenadine-pseudoephedrine (ALLEGRA-D 24) 180-240 MG per 24 hr tablet, Take 1 tablet by mouth daily as needed (for allergies)., Disp: , Rfl: ;  fluticasone (FLONASE) 50 MCG/ACT nasal spray, Place 1 spray into both nostrils daily., Disp: , Rfl:  ibuprofen (ADVIL,MOTRIN) 200 MG tablet, Take 200 mg by mouth as needed., Disp: , Rfl: ;  Multiple Vitamin (MULTIVITAMIN WITH MINERALS) TABS, Take 1 tablet by mouth daily., Disp: , Rfl: ;  Triamcinolone Acetonide (TRIAMCINOLONE 0.1 % CREAM : EUCERIN) CREA, Apply 1 application topically daily as needed. For eczema, Disp: , Rfl:  valACYclovir (VALTREX) 500 MG tablet, Take 1 tablet (500 mg total) by mouth as needed (Herpes Virus)., Disp: 90 tablet, Rfl: 3;  Witch  Hazel (PREPARATION H FOR WOMEN) 20 % PADS, Apply topically as needed., Disp: , Rfl: ;  dasatinib (SPRYCEL) 50 MG tablet, Take 1 tablet (50 mg total) by mouth daily., Disp: 30 tablet, Rfl: 6  Allergies:  Allergies  Allergen Reactions  . Ciprofloxacin     rash  . Levaquin [Levofloxacin]     rash    Past Medical History, Surgical history, Social history, and Family History were reviewed and updated.  Review of Systems: As above  Physical Exam:  height is 5\' 10"  (1.778 m) and weight is 173 lb (78.472 kg). Her oral temperature is 98 F (36.7 C). Her blood pressure is 112/51 and her pulse is 63. Her respiration is 14.   Well-developed and well-nourished white female. Head and neck exam shows no ocular or oral lesions. I looked into her ears. Both tympanic membranes were clean with a good light reflex. She has no adenopathy. There is some slight pharyngeal erythema. Lungs are clear. Cardiac exam regular rate and rhythm with no murmurs rubs or bruits. Abdomen is soft. There are no fluid wave. There is no palpable liver or spleen tip. She has well-healed laparoscopic wounds. Back exam shows no tenderness over the spine, ribs or hips. Extremities shows no clubbing, cyanosis or edema. Neurological exam is nonfocal. Skin exam shows no rashes, ecchymoses or petechia.  Lab Results  Component Value Date   WBC 5.4 10/12/2014   HGB 12.9 10/12/2014  HCT 38.9 10/12/2014   MCV 97 10/12/2014   PLT 198 10/12/2014     Chemistry      Component Value Date/Time   NA 143 10/12/2014 0832   NA 141 03/19/2014 0938   K 4.5 10/12/2014 0832   K 4.2 03/19/2014 0938   CL 102 10/12/2014 0832   CL 103 03/19/2014 0938   CO2 30 10/12/2014 0832   CO2 27 03/19/2014 0938   BUN 14 10/12/2014 0832   BUN 14 03/19/2014 0938   CREATININE 1.1 10/12/2014 0832   CREATININE 0.91 03/19/2014 0938      Component Value Date/Time   CALCIUM 9.7 10/12/2014 0832   CALCIUM 9.9 03/19/2014 0938   ALKPHOS 57 10/12/2014 0832    ALKPHOS 59 03/19/2014 0938   AST 19 10/12/2014 0832   AST 20 03/19/2014 0938   ALT 18 10/12/2014 0832   ALT 23 03/19/2014 0938   BILITOT 0.60 10/12/2014 0832   BILITOT 0.3 03/19/2014 0938         Impression and Plan: Christina Kelly is a 48 year old female with chronic phase CML. She was diagnosed back in May of last year. She's been a major molecular remission.  We will continue her on the Sprycel. We will cut her dose of Sprycel back. Maybe this will help her with any symptoms. I think 50 mg they would be appropriate.  We will have to see about making a referral for audiology.  I will plan to see her back in another 3-4 months.  We spent about 30 minutes with her today. I was trying to help her sort out these issues with this tinnitus.    [name], MD 12/9/20157:27 AM

## 2014-10-18 ENCOUNTER — Telehealth: Payer: Self-pay | Admitting: Hematology & Oncology

## 2014-10-18 NOTE — Telephone Encounter (Signed)
[facility] has APPROVED the SPRYCEL TAB  and is good until 10/14/2017.         COPY SCANNED

## 2014-10-19 ENCOUNTER — Telehealth: Payer: Self-pay | Admitting: Nurse Practitioner

## 2014-10-19 NOTE — Telephone Encounter (Addendum)
-----   Message from [name], MD sent at 10/18/2014  6:24 PM EST ----- Please call and let her know that she is still an remission from her chronic leukemia. Thanks.Christina Kelly  Pt verbalized understanding and appreciation.

## 2014-11-10 ENCOUNTER — Other Ambulatory Visit: Payer: BC Managed Care – PPO | Admitting: Lab

## 2014-11-10 ENCOUNTER — Ambulatory Visit: Payer: BC Managed Care – PPO | Admitting: Hematology & Oncology

## 2014-12-28 ENCOUNTER — Other Ambulatory Visit: Payer: Self-pay

## 2014-12-28 DIAGNOSIS — Z1231 Encounter for screening mammogram for malignant neoplasm of breast: Secondary | ICD-10-CM

## 2015-01-05 ENCOUNTER — Other Ambulatory Visit: Payer: Self-pay | Admitting: Family Medicine

## 2015-01-05 ENCOUNTER — Ambulatory Visit
Admission: RE | Admit: 2015-01-05 | Discharge: 2015-01-05 | Disposition: A | Discharge status: Home / Self Care | Payer: BLUE CROSS/BLUE SHIELD | Source: Ambulatory Visit

## 2015-01-05 DIAGNOSIS — R928 Other abnormal and inconclusive findings on diagnostic imaging of breast: Secondary | ICD-10-CM

## 2015-01-05 DIAGNOSIS — Z1231 Encounter for screening mammogram for malignant neoplasm of breast: Secondary | ICD-10-CM

## 2015-01-11 ENCOUNTER — Ambulatory Visit
Admission: RE | Admit: 2015-01-11 | Discharge: 2015-01-11 | Disposition: A | Discharge status: Home / Self Care | Payer: BLUE CROSS/BLUE SHIELD | Source: Ambulatory Visit | Attending: Family Medicine | Admitting: Family Medicine

## 2015-01-11 DIAGNOSIS — R928 Other abnormal and inconclusive findings on diagnostic imaging of breast: Secondary | ICD-10-CM

## 2015-02-08 ENCOUNTER — Other Ambulatory Visit (HOSPITAL_BASED_OUTPATIENT_CLINIC_OR_DEPARTMENT_OTHER): Payer: BLUE CROSS/BLUE SHIELD

## 2015-02-08 ENCOUNTER — Ambulatory Visit (HOSPITAL_BASED_OUTPATIENT_CLINIC_OR_DEPARTMENT_OTHER): Payer: BLUE CROSS/BLUE SHIELD | Admitting: Family

## 2015-02-08 DIAGNOSIS — C9291 Myeloid leukemia, unspecified in remission: Secondary | ICD-10-CM

## 2015-02-08 DIAGNOSIS — C9210 Chronic myeloid leukemia, BCR/ABL-positive, not having achieved remission: Secondary | ICD-10-CM

## 2015-02-08 LAB — CBC WITH DIFFERENTIAL (CANCER CENTER ONLY)
BASO#: 0 10*3/uL (ref 0.0–0.2)
BASO%: 0.8 % (ref 0.0–2.0)
EOS%: 5.6 % (ref 0.0–7.0)
Eosinophils Absolute: 0.3 10*3/uL (ref 0.0–0.5)
HCT: 38.8 % (ref 34.8–46.6)
HGB: 12.7 g/dL (ref 11.6–15.9)
LYMPH#: 2.5 10*3/uL (ref 0.9–3.3)
LYMPH%: 51.8 % — ABNORMAL HIGH (ref 14.0–48.0)
MCH: 31.4 pg (ref 26.0–34.0)
MCHC: 32.7 g/dL (ref 32.0–36.0)
MCV: 96 fL (ref 81–101)
MONO#: 0.3 10*3/uL (ref 0.1–0.9)
MONO%: 6.8 % (ref 0.0–13.0)
NEUT%: 35 % — ABNORMAL LOW (ref 39.6–80.0)
NEUTROS ABS: 1.7 10*3/uL (ref 1.5–6.5)
PLATELETS: 199 10*3/uL (ref 145–400)
RBC: 4.05 10*6/uL (ref 3.70–5.32)
RDW: 13.3 % (ref 11.1–15.7)
WBC: 4.8 10*3/uL (ref 3.9–10.0)

## 2015-02-08 NOTE — Progress Notes (Signed)
Hematology and Oncology Follow Up Visit  Christina Kelly [id number] January 02, 1966 49 y.o. 02/08/2015   Principle Diagnosis:  Chronic phase CML-major molecular remission  Current Therapy:   Sprycel 50 mg by mouth daily    Interim History:  Christina Kelly is here today for a follow-up. She is doing well. She still has some tinnitus but has not seen ENT yet. She is going to make an appointment with Dr. Charleston Ropes.  She is doing well the Sprycel 50 mg daily. In September, she was in major molecular remission. Her BCR/ABL ratio was 0.05%.  She has had no problem with infection. No fever, chills, n/v, cough, rash, dizziness, headaches, SOB, chest pain, palpitations, abdominal pain, constipation, diarrhea, blood in urine or stool. No episodes of bleeding.  No swelling, tenderness, numbness or tingling in her extremities. No new aches or pains.  Her appetite is good and she is staying hydrated. She has gained a little weight and is now trying to exercise more.   Medications:    Medication List       This list is accurate as of: 02/08/15  9:02 AM.  Always use your most recent med list.               Christina Kelly 0.25 MG tablet  Commonly known as:  XANAX  Take 0.125 mg by mouth at bedtime as needed for sleep.     dasatinib 50 MG tablet  Commonly known as:  SPRYCEL  Take 1 tablet (50 mg total) by mouth daily.     fexofenadine-pseudoephedrine 180-240 MG per 24 hr tablet  Commonly known as:  ALLEGRA-D 24  Take 1 tablet by mouth daily as needed (for allergies).     fluticasone 50 MCG/ACT nasal spray  Commonly known as:  FLONASE  Place 1 spray into both nostrils daily.     ibuprofen 200 MG tablet  Commonly known as:  ADVIL,MOTRIN  Take 200 mg by mouth as needed.     multivitamin with minerals Tabs tablet  Take 1 tablet by mouth daily.     PREPARATION H FOR WOMEN 20 % Pads  Apply topically as needed.     triamcinolone 0.1 % cream : eucerin Crea  Apply 1 application topically daily as  needed. For eczema     valACYclovir 500 MG tablet  Commonly known as:  VALTREX  Take 1 tablet (500 mg total) by mouth as needed (Herpes Virus).        Allergies:  Allergies  Allergen Reactions  . Ciprofloxacin     rash  . Levaquin [Levofloxacin]     rash    Past Medical History, Surgical history, Social history, and Family History were reviewed and updated.  Review of Systems: All other 10 point review of systems is negative.   Physical Exam:  vitals were not taken for this visit.  Wt Readings from Last 3 Encounters:  10/12/14 173 lb (78.472 kg)  03/19/14 165 lb (74.844 kg)  11/20/13 167 lb (75.751 kg)    Ocular: Sclerae unicteric, pupils equal, round and reactive to light Ear-nose-throat: Oropharynx clear, dentition fair Lymphatic: No cervical or supraclavicular adenopathy Lungs no rales or rhonchi, good excursion bilaterally Heart regular rate and rhythm, no murmur appreciated Abd soft, nontender, positive bowel sounds MSK no focal spinal tenderness, no joint edema Neuro: non-focal, well-oriented, appropriate affect Breasts: Deferred  Lab Results  Component Value Date   WBC 5.4 10/12/2014   HGB 12.9 10/12/2014   HCT 38.9 10/12/2014   MCV 97  10/12/2014   PLT 198 10/12/2014   No results found for: FERRITIN, IRON, TIBC, UIBC, IRONPCTSAT Lab Results  Component Value Date   RBC 4.02 10/12/2014   No results found for: KPAFRELGTCHN, LAMBDASER, KAPLAMBRATIO No results found for: IGGSERUM, IGA, IGMSERUM No results found for: Odetta Pink, SPEI   Chemistry      Component Value Date/Time   NA 143 10/12/2014 0832   NA 141 03/19/2014 0938   K 4.5 10/12/2014 0832   K 4.2 03/19/2014 0938   CL 102 10/12/2014 0832   CL 103 03/19/2014 0938   CO2 30 10/12/2014 0832   CO2 27 03/19/2014 0938   BUN 14 10/12/2014 0832   BUN 14 03/19/2014 0938   CREATININE 1.1 10/12/2014 0832   CREATININE 0.91 03/19/2014 0938       Component Value Date/Time   CALCIUM 9.7 10/12/2014 0832   CALCIUM 9.9 03/19/2014 0938   ALKPHOS 57 10/12/2014 0832   ALKPHOS 59 03/19/2014 0938   AST 19 10/12/2014 0832   AST 20 03/19/2014 0938   ALT 18 10/12/2014 0832   ALT 23 03/19/2014 0938   BILITOT 0.60 10/12/2014 0832   BILITOT 0.3 03/19/2014 0938     Impression and Plan: Christina Kelly is a 49 year old female with chronic phase CML. She was diagnosed back in May of last year and has been in major molecular remission. She is doing well on Sprycel and is asymptomatic at this time.  We will see what her lab work from today shows.  She will follow-up with Dr. Charleston Ropes regarding her tinnitus.  We will see her back in 4 months for labs and follow-up.  She knows to call here with any questions or concerns. We can certainly see her sooner if need be.   Eliezer Bottom, NP 4/5/20169:02 AM

## 2015-02-09 LAB — COMPREHENSIVE METABOLIC PANEL
ALK PHOS: 64 U/L (ref 39–117)
ALT: 11 U/L (ref 0–35)
AST: 16 U/L (ref 0–37)
Albumin: 4.4 g/dL (ref 3.5–5.2)
BUN: 15 mg/dL (ref 6–23)
CO2: 29 mEq/L (ref 19–32)
Calcium: 9.9 mg/dL (ref 8.4–10.5)
Chloride: 102 mEq/L (ref 96–112)
Creatinine, Ser: 1 mg/dL (ref 0.50–1.10)
Glucose, Bld: 76 mg/dL (ref 70–99)
Potassium: 4.5 mEq/L (ref 3.5–5.3)
SODIUM: 138 meq/L (ref 135–145)
TOTAL PROTEIN: 6.9 g/dL (ref 6.0–8.3)
Total Bilirubin: 0.4 mg/dL (ref 0.2–1.2)

## 2015-02-09 LAB — MAGNESIUM: Magnesium: 2.2 mg/dL (ref 1.5–2.5)

## 2015-02-15 ENCOUNTER — Telehealth: Payer: Self-pay | Admitting: *Deleted

## 2015-02-15 NOTE — Telephone Encounter (Addendum)
Message left on personal voice mail  ----- Message from [name], MD sent at 02/15/2015  7:23 AM EDT ----- Call - still in remission!!!  Great job!!  pete

## 2015-04-19 ENCOUNTER — Other Ambulatory Visit: Payer: Self-pay | Admitting: Hematology & Oncology

## 2015-06-05 IMAGING — US US EXTREM LOW VENOUS*R*
1 series · 14 of 19 positions shown · non-contrast
Comparison: None.

CLINICAL DATA: Right foot and calf swelling, CML

EXAM:
RIGHT LOWER EXTREMITY VENOUS DOPPLER ULTRASOUND
TECHNIQUE: Gray-scale sonography with graded compression, as well as color
Doppler and duplex ultrasound, were performed to evaluate the deep
venous system from the level of the common femoral vein through the
popliteal and proximal calf veins. Spectral Doppler was utilized to
evaluate flow at rest and with distal augmentation maneuvers.

[Series 1: us extrem low venous*right* · 14 of 19 slices shown]
[im 1/19]
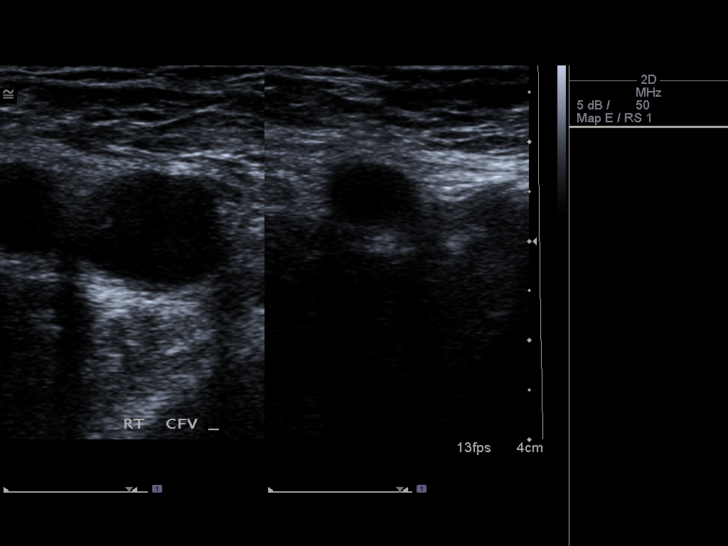
[im 3/19]
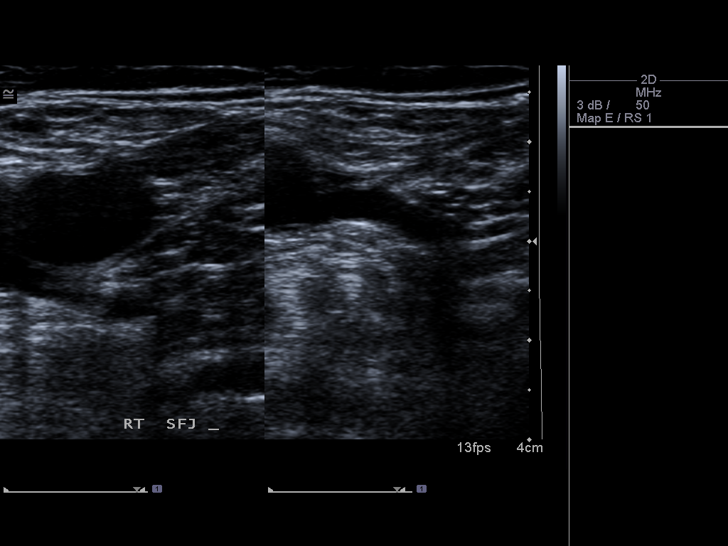
[im 4/19]
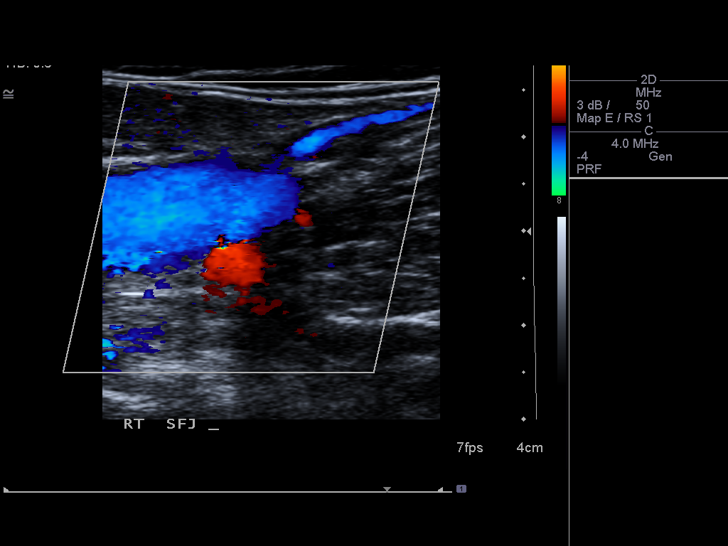
[im 5/19]
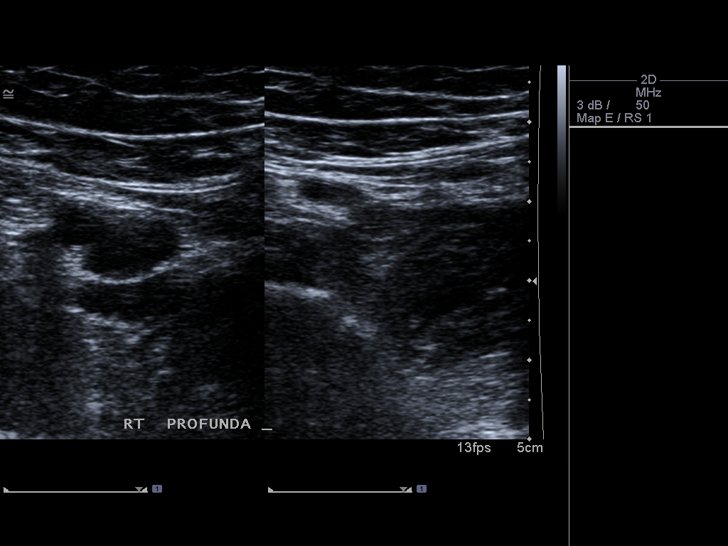
[im 7/19]
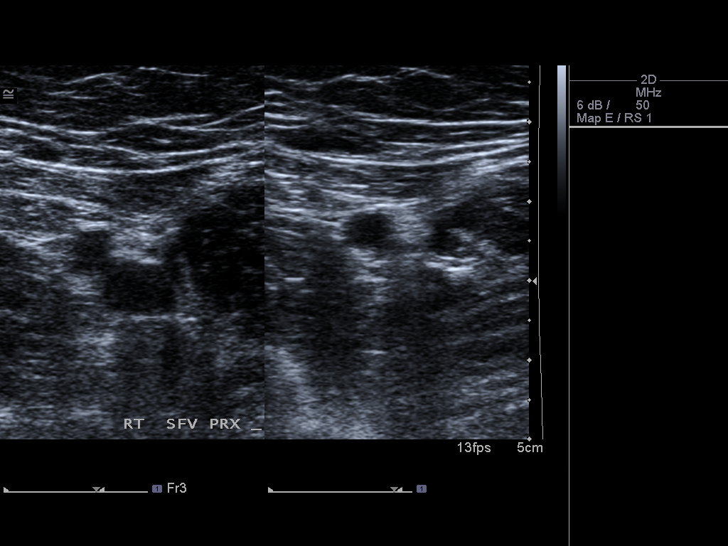
[im 8/19]
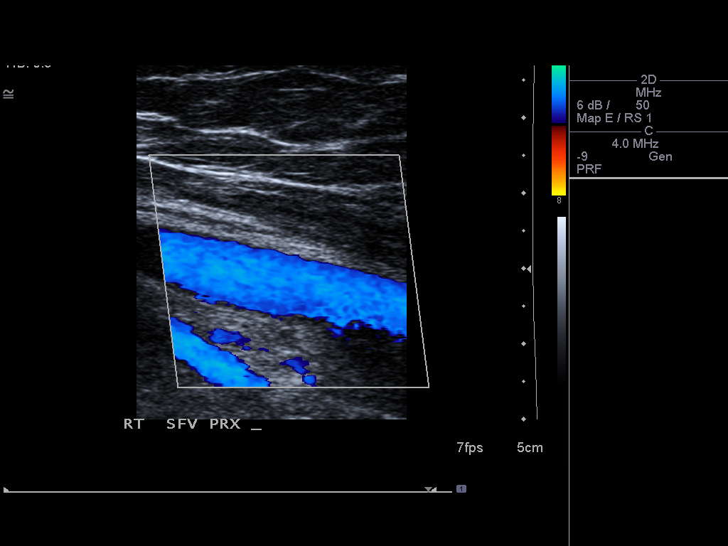
[im 9/19]
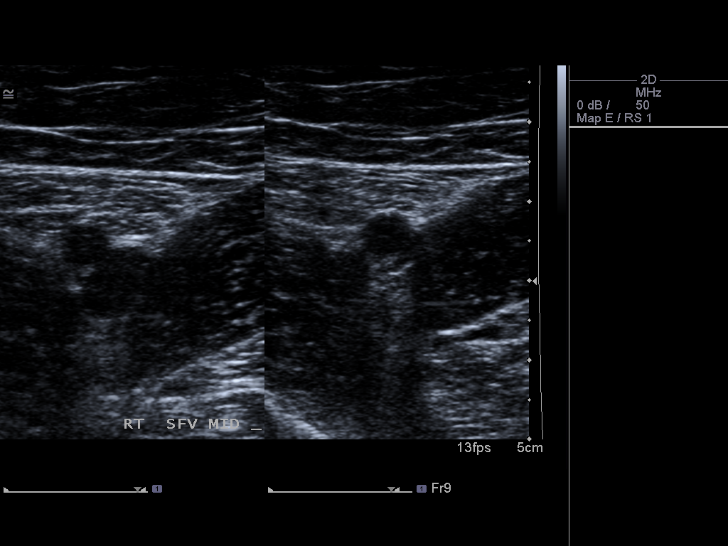
[im 11/19]
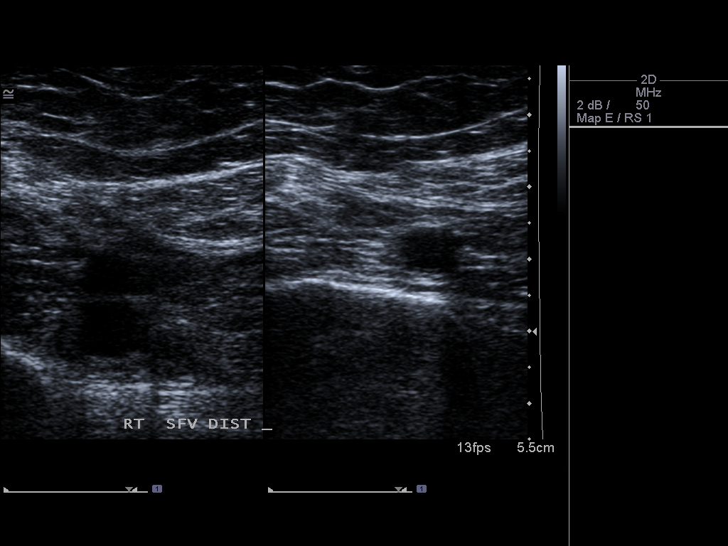
[im 12/19]
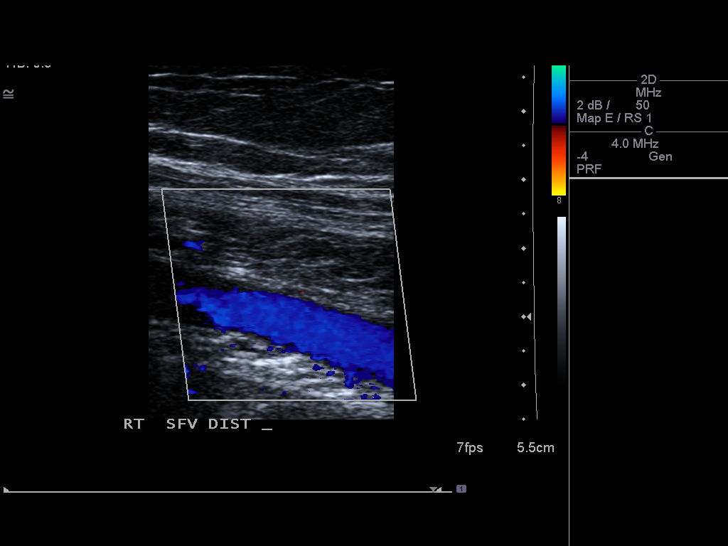
[im 13/19]
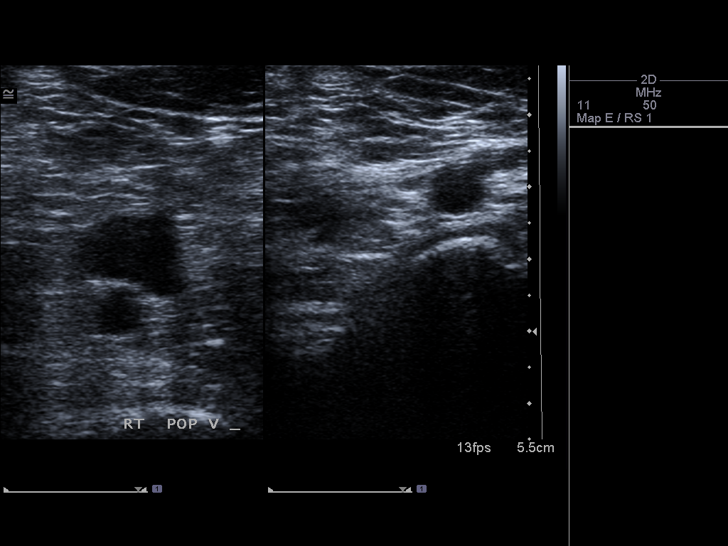
[im 15/19]
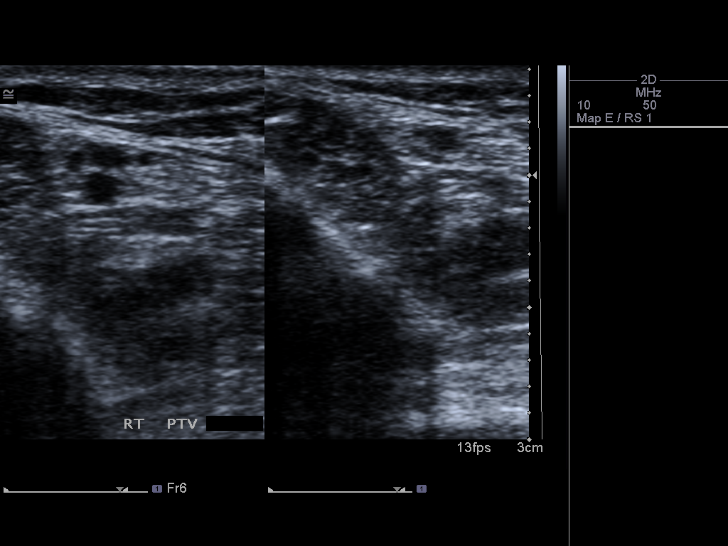
[im 16/19]
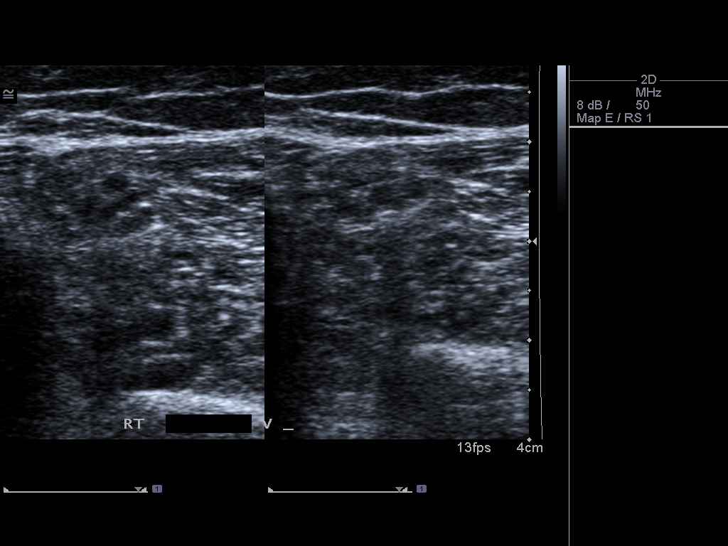
[im 17/19]
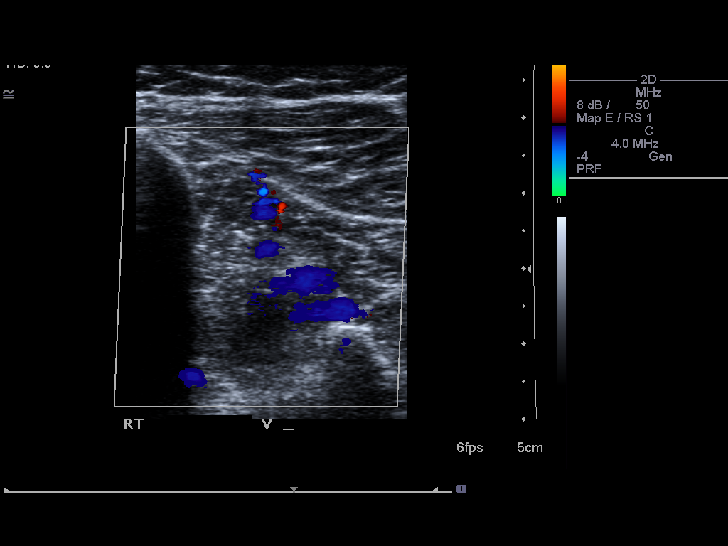
[im 19/19]
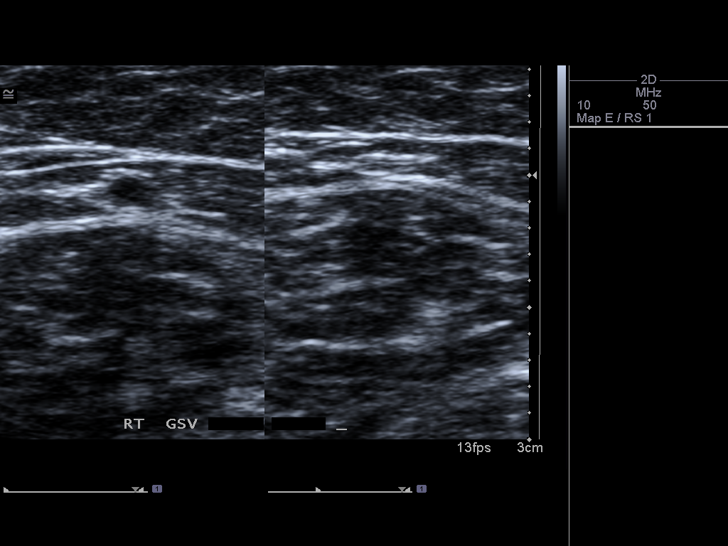

[14 of 19 positions shown; findings below may reference images not displayed]

FINDINGS: Thrombus within deep veins:  None visualized.

Compressibility of deep veins:  Normal.

Duplex waveform respiratory phasicity:  Normal.

Duplex waveform response to augmentation:  Normal.

Venous reflux:  None visualized.

Other findings: Great saphenous vein patent and compressible
throughout its course.
IMPRESSION: Negative for acute DVT.

## 2015-06-07 ENCOUNTER — Other Ambulatory Visit (HOSPITAL_BASED_OUTPATIENT_CLINIC_OR_DEPARTMENT_OTHER): Payer: BLUE CROSS/BLUE SHIELD

## 2015-06-07 ENCOUNTER — Encounter: Payer: Self-pay | Admitting: Hematology & Oncology

## 2015-06-07 ENCOUNTER — Ambulatory Visit (HOSPITAL_BASED_OUTPATIENT_CLINIC_OR_DEPARTMENT_OTHER): Payer: BLUE CROSS/BLUE SHIELD | Admitting: Hematology & Oncology

## 2015-06-07 ENCOUNTER — Other Ambulatory Visit: Payer: Self-pay | Admitting: *Deleted

## 2015-06-07 VITALS — BP 117/67 | HR 57 | Temp 98.3°F | Resp 14 | Ht 70.0 in | Wt 177.0 lb

## 2015-06-07 DIAGNOSIS — C9291 Myeloid leukemia, unspecified in remission: Secondary | ICD-10-CM

## 2015-06-07 DIAGNOSIS — L309 Dermatitis, unspecified: Secondary | ICD-10-CM | POA: Diagnosis not present

## 2015-06-07 DIAGNOSIS — C9210 Chronic myeloid leukemia, BCR/ABL-positive, not having achieved remission: Secondary | ICD-10-CM

## 2015-06-07 LAB — COMPREHENSIVE METABOLIC PANEL
ALK PHOS: 62 U/L (ref 33–115)
ALT: 15 U/L (ref 6–29)
AST: 16 U/L (ref 10–35)
Albumin: 4.3 g/dL (ref 3.6–5.1)
BUN: 18 mg/dL (ref 7–25)
CALCIUM: 9.6 mg/dL (ref 8.6–10.2)
CO2: 25 mmol/L (ref 20–31)
Chloride: 103 mmol/L (ref 98–110)
Creatinine, Ser: 0.93 mg/dL (ref 0.50–1.10)
Glucose, Bld: 78 mg/dL (ref 65–99)
POTASSIUM: 4.8 mmol/L (ref 3.5–5.3)
Sodium: 139 mmol/L (ref 135–146)
Total Bilirubin: 0.4 mg/dL (ref 0.2–1.2)
Total Protein: 6.7 g/dL (ref 6.1–8.1)

## 2015-06-07 LAB — CBC WITH DIFFERENTIAL (CANCER CENTER ONLY)
BASO#: 0 10*3/uL (ref 0.0–0.2)
BASO%: 0.2 % (ref 0.0–2.0)
EOS%: 6.7 % (ref 0.0–7.0)
Eosinophils Absolute: 0.4 10*3/uL (ref 0.0–0.5)
HEMATOCRIT: 37.6 % (ref 34.8–46.6)
HGB: 12.5 g/dL (ref 11.6–15.9)
LYMPH#: 2.7 10*3/uL (ref 0.9–3.3)
LYMPH%: 50.4 % — ABNORMAL HIGH (ref 14.0–48.0)
MCH: 32.2 pg (ref 26.0–34.0)
MCHC: 33.2 g/dL (ref 32.0–36.0)
MCV: 97 fL (ref 81–101)
MONO#: 0.3 10*3/uL (ref 0.1–0.9)
MONO%: 6.3 % (ref 0.0–13.0)
NEUT#: 2 10*3/uL (ref 1.5–6.5)
NEUT%: 36.4 % — ABNORMAL LOW (ref 39.6–80.0)
Platelets: 227 10*3/uL (ref 145–400)
RBC: 3.88 10*6/uL (ref 3.70–5.32)
RDW: 13.3 % (ref 11.1–15.7)
WBC: 5.4 10*3/uL (ref 3.9–10.0)

## 2015-06-07 LAB — MAGNESIUM: MAGNESIUM: 2.2 mg/dL (ref 1.5–2.5)

## 2015-06-07 MED ORDER — TRIAMCINOLONE 0.1 % CREAM:EUCERIN CREAM 1:1: 1.0000 "application " | TOPICAL_CREAM | Freq: Every day | CUTANEOUS | Status: DC | PRN

## 2015-06-07 NOTE — Progress Notes (Signed)
Hematology and Oncology Follow Up Visit  Christina Kelly [id number] 1965/12/30 49 y.o. 06/07/2015   Principle Diagnosis:   Chronic phase CML-major molecular remission  Current Therapy:    Sprycel 50 mg by mouth daily-to be stopped     Interim History:  Christina Kelly is back for followup. Back for follow-up. We last saw Christina Kelly about 3 or 4 months ago.  I think we can probably stop Christina Kelly Sprycel now. Of the weaning get Christina Kelly off the Sprycel. New studies come out to have shown that a majority of patients to stop their TKI remain in remission. I think that this would be helpful for Christina Kelly. She's had some problems with the Sprycel. She's had a lot of aggravation with taking it.  She now has these macular lesions on Christina Kelly skin. This started about a week or so ago. They are slightly pruritic. She has had chickenpox when she was a child. She has not had these before.  Again, she has been in a major molecular remission. Christina Kelly last BCR/ABL showed no detectable transcripts.  She's had no cough. She's had no nausea or vomiting. There's been no change in bowel or bladder habits.  She's had no leg swelling.   Overall, Christina Kelly performance status is ECOG 0. Medications:  Current outpatient prescriptions:  .  fluticasone (FLONASE) 50 MCG/ACT nasal spray, Place 1 spray into both nostrils daily., Disp: , Rfl:  .  ibuprofen (ADVIL,MOTRIN) 200 MG tablet, Take 200 mg by mouth as needed., Disp: , Rfl:  .  loratadine (CLARITIN) 10 MG tablet, Take 10 mg by mouth daily as needed for allergies., Disp: , Rfl:  .  Multiple Vitamin (MULTIVITAMIN WITH MINERALS) TABS, Take 1 tablet by mouth daily., Disp: , Rfl:  .  SPRYCEL 50 MG tablet, TAKE 1 TABLET (50 MG TOTAL) DAILY, Disp: 30 tablet, Rfl: 5 .  valACYclovir (VALTREX) 500 MG tablet, Take 1 tablet (500 mg total) by mouth as needed (Herpes Virus)., Disp: 90 tablet, Rfl: 3 .  Witch Hazel (PREPARATION H FOR WOMEN) 20 % PADS, Apply topically as needed., Disp: , Rfl:  .   ALPRAZolam (XANAX) 0.25 MG tablet, Take 0.125 mg by mouth at bedtime as needed for sleep., Disp: , Rfl:  .  Melatonin 1 MG CAPS, Take 1 mg by mouth., Disp: , Rfl:  .  Triamcinolone Acetonide (TRIAMCINOLONE 0.1 % CREAM : EUCERIN) CREA, Apply 1 application topically daily as needed. For eczema, Disp: 1 each, Rfl: 6  Allergies:  Allergies  Allergen Reactions  . Ciprofloxacin     rash  . Levaquin [Levofloxacin]     rash    Past Medical History, Surgical history, Social history, and Family History were reviewed and updated.  Review of Systems: As above  Physical Exam:  height is 5\' 10"  (1.778 m) and weight is 177 lb (80.287 kg). Christina Kelly oral temperature is 98.3 F (36.8 C). Christina Kelly blood pressure is 117/67 and Christina Kelly pulse is 57. Christina Kelly respiration is 14.   Well-developed and well-nourished white female. Head and neck exam shows no ocular or oral lesions. I looked into Christina Kelly ears. Both tympanic membranes were clean with a good light reflex. She has no adenopathy. There is some slight pharyngeal erythema. Lungs are clear. Cardiac exam regular rate and rhythm with no murmurs rubs or bruits. Abdomen is soft. There are no fluid wave. There is no palpable liver or spleen tip. She has well-healed laparoscopic wounds. Back exam shows no tenderness over the spine, ribs or hips. Extremities  shows no clubbing, cyanosis or edema. Neurological exam is nonfocal. Skin exam shows focal areas of macular lesions. There is no blister associated with this. There is no pustules. They're slightly raised.  Lab Results  Component Value Date   WBC 5.4 06/07/2015   HGB 12.5 06/07/2015   HCT 37.6 06/07/2015   MCV 97 06/07/2015   PLT 227 06/07/2015     Chemistry      Component Value Date/Time   NA 138 02/08/2015 0847   NA 143 10/12/2014 0832   K 4.5 02/08/2015 0847   K 4.5 10/12/2014 0832   CL 102 02/08/2015 0847   CL 102 10/12/2014 0832   CO2 29 02/08/2015 0847   CO2 30 10/12/2014 0832   BUN 15 02/08/2015 0847   BUN 14  10/12/2014 0832   CREATININE 1.00 02/08/2015 0847   CREATININE 1.1 10/12/2014 0832      Component Value Date/Time   CALCIUM 9.9 02/08/2015 0847   CALCIUM 9.7 10/12/2014 0832   ALKPHOS 64 02/08/2015 0847   ALKPHOS 57 10/12/2014 0832   AST 16 02/08/2015 0847   AST 19 10/12/2014 0832   ALT 11 02/08/2015 0847   ALT 18 10/12/2014 0832   BILITOT 0.4 02/08/2015 0847   BILITOT 0.60 10/12/2014 0832         Impression and Plan: Ms. Vanderkolk is a 49 year old female with chronic phase CML. She was diagnosed back in May of 2014.. She's been a major molecular remission.  Again, we'll stop the Sprycel. We will have Christina Kelly BCR/ABL checked every 2 months. Most patients who relapsed did so within 6 months. However, 99% who relapsed when restarted on their medicine went back into remission.  Otherwise, I will plan to see Christina Kelly back in 4 months myself.  I spent about half hour with Christina Kelly doctor about stopping the Sprycel and the implications.  [name], MD 8/2/201610:33 AM

## 2015-06-15 ENCOUNTER — Telehealth: Payer: Self-pay | Admitting: *Deleted

## 2015-06-15 NOTE — Telephone Encounter (Addendum)
Patient aware of results.  ----- Message from [name], MD sent at 06/15/2015  1:11 PM EDT ----- Call and tell her that there is no detectable CML in the blood. Thanks

## 2015-07-13 ENCOUNTER — Encounter: Payer: Self-pay | Admitting: Hematology & Oncology

## 2015-08-09 ENCOUNTER — Other Ambulatory Visit (HOSPITAL_BASED_OUTPATIENT_CLINIC_OR_DEPARTMENT_OTHER): Payer: BLUE CROSS/BLUE SHIELD

## 2015-08-09 DIAGNOSIS — C9210 Chronic myeloid leukemia, BCR/ABL-positive, not having achieved remission: Secondary | ICD-10-CM

## 2015-08-09 DIAGNOSIS — L309 Dermatitis, unspecified: Secondary | ICD-10-CM

## 2015-08-09 DIAGNOSIS — C9291 Myeloid leukemia, unspecified in remission: Secondary | ICD-10-CM | POA: Diagnosis not present

## 2015-08-09 LAB — CBC WITH DIFFERENTIAL (CANCER CENTER ONLY)
BASO#: 0 10*3/uL (ref 0.0–0.2)
BASO%: 0.6 % (ref 0.0–2.0)
EOS ABS: 0.3 10*3/uL (ref 0.0–0.5)
EOS%: 5.5 % (ref 0.0–7.0)
HCT: 40.8 % (ref 34.8–46.6)
HEMOGLOBIN: 13.4 g/dL (ref 11.6–15.9)
LYMPH#: 2.1 10*3/uL (ref 0.9–3.3)
LYMPH%: 43.8 % (ref 14.0–48.0)
MCH: 31.2 pg (ref 26.0–34.0)
MCHC: 32.8 g/dL (ref 32.0–36.0)
MCV: 95 fL (ref 81–101)
MONO#: 0.3 10*3/uL (ref 0.1–0.9)
MONO%: 6.6 % (ref 0.0–13.0)
NEUT#: 2.1 10*3/uL (ref 1.5–6.5)
NEUT%: 43.5 % (ref 39.6–80.0)
PLATELETS: 191 10*3/uL (ref 145–400)
RBC: 4.29 10*6/uL (ref 3.70–5.32)
RDW: 12.8 % (ref 11.1–15.7)
WBC: 4.7 10*3/uL (ref 3.9–10.0)

## 2015-08-09 LAB — CHCC SATELLITE - SMEAR

## 2015-08-22 ENCOUNTER — Telehealth: Payer: Self-pay | Admitting: *Deleted

## 2015-08-22 NOTE — Telephone Encounter (Signed)
Patient called asking if she can get flu and pneumonia shot.  [place] with Dr. Marin Olp

## 2015-08-23 ENCOUNTER — Telehealth: Payer: Self-pay | Admitting: Nurse Practitioner

## 2015-08-23 NOTE — Telephone Encounter (Signed)
-----   Message from [name], MD sent at 08/22/2015  1:49 PM EDT ----- Call - NO detectable CML gene!!!  Laurey Arrow

## 2015-10-11 ENCOUNTER — Ambulatory Visit (HOSPITAL_BASED_OUTPATIENT_CLINIC_OR_DEPARTMENT_OTHER): Payer: BLUE CROSS/BLUE SHIELD | Admitting: Hematology & Oncology

## 2015-10-11 ENCOUNTER — Other Ambulatory Visit (HOSPITAL_BASED_OUTPATIENT_CLINIC_OR_DEPARTMENT_OTHER): Payer: BLUE CROSS/BLUE SHIELD

## 2015-10-11 ENCOUNTER — Encounter: Payer: Self-pay | Admitting: Hematology & Oncology

## 2015-10-11 VITALS — BP 116/76 | HR 59 | Temp 97.9°F | Resp 20 | Ht 70.0 in | Wt 178.0 lb

## 2015-10-11 DIAGNOSIS — L309 Dermatitis, unspecified: Secondary | ICD-10-CM

## 2015-10-11 DIAGNOSIS — C9211 Chronic myeloid leukemia, BCR/ABL-positive, in remission: Secondary | ICD-10-CM | POA: Diagnosis not present

## 2015-10-11 DIAGNOSIS — C9210 Chronic myeloid leukemia, BCR/ABL-positive, not having achieved remission: Secondary | ICD-10-CM

## 2015-10-11 LAB — CBC WITH DIFFERENTIAL (CANCER CENTER ONLY)
BASO#: 0 10*3/uL (ref 0.0–0.2)
BASO%: 0.5 % (ref 0.0–2.0)
EOS%: 3.7 % (ref 0.0–7.0)
Eosinophils Absolute: 0.2 10*3/uL (ref 0.0–0.5)
HEMATOCRIT: 40.3 % (ref 34.8–46.6)
HGB: 13.3 g/dL (ref 11.6–15.9)
LYMPH#: 1.9 10*3/uL (ref 0.9–3.3)
LYMPH%: 30.3 % (ref 14.0–48.0)
MCH: 30.5 pg (ref 26.0–34.0)
MCHC: 33 g/dL (ref 32.0–36.0)
MCV: 92 fL (ref 81–101)
MONO#: 0.5 10*3/uL (ref 0.1–0.9)
MONO%: 7.6 % (ref 0.0–13.0)
NEUT#: 3.7 10*3/uL (ref 1.5–6.5)
NEUT%: 57.9 % (ref 39.6–80.0)
PLATELETS: 191 10*3/uL (ref 145–400)
RBC: 4.36 10*6/uL (ref 3.70–5.32)
RDW: 13 % (ref 11.1–15.7)
WBC: 6.3 10*3/uL (ref 3.9–10.0)

## 2015-10-11 LAB — COMPREHENSIVE METABOLIC PANEL
ALBUMIN: 4 g/dL (ref 3.5–5.0)
ALK PHOS: 74 U/L (ref 40–150)
ALT: 15 U/L (ref 0–55)
AST: 19 U/L (ref 5–34)
Anion Gap: 9 mEq/L (ref 3–11)
BUN: 15.5 mg/dL (ref 7.0–26.0)
CALCIUM: 9.6 mg/dL (ref 8.4–10.4)
CHLORIDE: 106 meq/L (ref 98–109)
CO2: 25 mEq/L (ref 22–29)
Creatinine: 1 mg/dL (ref 0.6–1.1)
EGFR: 70 mL/min/{1.73_m2} — ABNORMAL LOW (ref >90)
Glucose: 81 mg/dl (ref 70–140)
POTASSIUM: 4.5 meq/L (ref 3.5–5.1)
Sodium: 140 mEq/L (ref 136–145)
Total Bilirubin: 0.38 mg/dL (ref 0.20–1.20)
Total Protein: 7.2 g/dL (ref 6.4–8.3)

## 2015-10-11 LAB — CHCC SATELLITE - SMEAR

## 2015-10-11 NOTE — Progress Notes (Signed)
Hematology and Oncology Follow Up Visit  BRITTON KOSTIUK [id number] 1966-04-30 49 y.o. 10/11/2015   Principle Diagnosis:   Chronic phase CML-major molecular remission  Current Therapy:    Observation     Interim History:  Ms.  Christina Kelly is back for followup. She is doing quite well. She now is off the Sprycel. She enjoys being off Sprycel.  She's had no problems with nausea or vomiting. She's had no problems with bowels or bladder.  She has trying to exercise a little bit more. She had her hysterectomy back in February 2014. She has recovered from this. However, she is postmenopausal so she is trying not to gain too much weight.  She has had no rashes.. She's had no cough or shortness of breath.  She's had no bleeding or bruising.    Overall, her performance status is ECOG 0. Medications:  Current outpatient prescriptions:  .  ALPRAZolam (XANAX) 0.25 MG tablet, Take 0.125 mg by mouth at bedtime as needed for sleep., Disp: , Rfl:  .  fluticasone (FLONASE) 50 MCG/ACT nasal spray, Place 1 spray into both nostrils daily., Disp: , Rfl:  .  ibuprofen (ADVIL,MOTRIN) 200 MG tablet, Take 200 mg by mouth as needed., Disp: , Rfl:  .  loratadine (CLARITIN) 10 MG tablet, Take 10 mg by mouth daily as needed for allergies., Disp: , Rfl:  .  Melatonin 1 MG CAPS, Take 1 mg by mouth., Disp: , Rfl:  .  MINIVELLE 0.1 MG/24HR patch, APPLY 1 PATCH TWICE WEEKLY AS DIRECTED., Disp: , Rfl: 12 .  Multiple Vitamin (MULTIVITAMIN WITH MINERALS) TABS, Take 1 tablet by mouth daily., Disp: , Rfl:  .  SPRYCEL 50 MG tablet, TAKE 1 TABLET (50 MG TOTAL) DAILY, Disp: 30 tablet, Rfl: 5 .  Triamcinolone Acetonide (TRIAMCINOLONE 0.1 % CREAM : EUCERIN) CREA, Apply 1 application topically daily as needed. For eczema, Disp: 1 each, Rfl: 6 .  valACYclovir (VALTREX) 500 MG tablet, Take 1 tablet (500 mg total) by mouth as needed (Herpes Virus)., Disp: 90 tablet, Rfl: 3 .  Witch Hazel (PREPARATION H FOR WOMEN) 20 % PADS,  Apply topically as needed., Disp: , Rfl:   Allergies:  Allergies  Allergen Reactions  . Ciprofloxacin     rash  . Levaquin [Levofloxacin]     rash    Past Medical History, Surgical history, Social history, and Family History were reviewed and updated.  Review of Systems: As above  Physical Exam:  height is 5\' 10"  (1.778 m) and weight is 178 lb (80.74 kg). Her oral temperature is 97.9 F (36.6 C). Her blood pressure is 116/76 and her pulse is 59. Her respiration is 20.   Well-developed and well-nourished white female. Head and neck exam shows no ocular or oral lesions. I looked into her ears. Both tympanic membranes were clean with a good light reflex. She has no adenopathy. There is some slight pharyngeal erythema. Lungs are clear. Cardiac exam regular rate and rhythm with no murmurs rubs or bruits. Abdomen is soft. There are no fluid wave. There is no palpable liver or spleen tip. She has well-healed laparoscopic wounds. Back exam shows no tenderness over the spine, ribs or hips. Extremities shows no clubbing, cyanosis or edema. Neurological exam is nonfocal. Skin exam shows focal areas of macular lesions. There is no blister associated with this. There is no pustules. They're slightly raised.  Lab Results  Component Value Date   WBC 6.3 10/11/2015   HGB 13.3 10/11/2015   HCT 40.3  10/11/2015   MCV 92 10/11/2015   PLT 191 10/11/2015     Chemistry      Component Value Date/Time   NA 139 06/07/2015 0839   NA 143 10/12/2014 0832   K 4.8 06/07/2015 0839   K 4.5 10/12/2014 0832   CL 103 06/07/2015 0839   CL 102 10/12/2014 0832   CO2 25 06/07/2015 0839   CO2 30 10/12/2014 0832   BUN 18 06/07/2015 0839   BUN 14 10/12/2014 0832   CREATININE 0.93 06/07/2015 0839   CREATININE 1.1 10/12/2014 0832      Component Value Date/Time   CALCIUM 9.6 06/07/2015 0839   CALCIUM 9.7 10/12/2014 0832   ALKPHOS 62 06/07/2015 0839   ALKPHOS 57 10/12/2014 0832   AST 16 06/07/2015 0839   AST 19  10/12/2014 0832   ALT 15 06/07/2015 0839   ALT 18 10/12/2014 0832   BILITOT 0.4 06/07/2015 0839   BILITOT 0.60 10/12/2014 0832         Impression and Plan: Ms. Loeffler is a 49 year old female with chronic phase CML. She was diagnosed back in May of 2014.. She's been a major molecular remission.  We will see what her BCR/ ABL ratio looks like. I looked at her blood on the microscope and everything looks okay.  She has good maturation of her white cells. I do not see any nucleated red cells. I do not see any abnormal platelets.  We will plan to get her back in 4 months.  [name], MD 12/6/20169:40 AM

## 2015-10-19 ENCOUNTER — Encounter: Payer: Self-pay | Admitting: Hematology & Oncology

## 2015-10-21 ENCOUNTER — Telehealth: Payer: Self-pay | Admitting: *Deleted

## 2015-10-21 NOTE — Telephone Encounter (Signed)
Dr Marin Olp requested that patient be called and notified that labs show she is still in CML remission.  Spoke with patient and she is aware.

## 2016-02-06 ENCOUNTER — Other Ambulatory Visit (HOSPITAL_BASED_OUTPATIENT_CLINIC_OR_DEPARTMENT_OTHER): Payer: BLUE CROSS/BLUE SHIELD

## 2016-02-06 ENCOUNTER — Ambulatory Visit (HOSPITAL_BASED_OUTPATIENT_CLINIC_OR_DEPARTMENT_OTHER): Payer: BLUE CROSS/BLUE SHIELD | Admitting: Hematology & Oncology

## 2016-02-06 ENCOUNTER — Encounter: Payer: Self-pay | Admitting: Hematology & Oncology

## 2016-02-06 VITALS — BP 102/67 | HR 53 | Temp 97.4°F | Resp 16 | Ht 70.0 in | Wt 180.0 lb

## 2016-02-06 DIAGNOSIS — I499 Cardiac arrhythmia, unspecified: Secondary | ICD-10-CM

## 2016-02-06 DIAGNOSIS — C9211 Chronic myeloid leukemia, BCR/ABL-positive, in remission: Secondary | ICD-10-CM

## 2016-02-06 DIAGNOSIS — C9210 Chronic myeloid leukemia, BCR/ABL-positive, not having achieved remission: Secondary | ICD-10-CM

## 2016-02-06 DIAGNOSIS — I498 Other specified cardiac arrhythmias: Secondary | ICD-10-CM

## 2016-02-06 LAB — CMP (CANCER CENTER ONLY)
ALT(SGPT): 21 U/L (ref 10–47)
AST: 24 U/L (ref 11–38)
Albumin: 3.8 g/dL (ref 3.3–5.5)
Alkaline Phosphatase: 52 U/L (ref 26–84)
BUN: 12 mg/dL (ref 7–22)
CHLORIDE: 106 meq/L (ref 98–108)
CO2: 30 mEq/L (ref 18–33)
Calcium: 9.8 mg/dL (ref 8.0–10.3)
Creat: 0.9 mg/dl (ref 0.6–1.2)
Glucose, Bld: 82 mg/dL (ref 73–118)
POTASSIUM: 4.6 meq/L (ref 3.3–4.7)
Sodium: 141 mEq/L (ref 128–145)
TOTAL PROTEIN: 7 g/dL (ref 6.4–8.1)
Total Bilirubin: 0.7 mg/dl (ref 0.20–1.60)

## 2016-02-06 LAB — CBC WITH DIFFERENTIAL (CANCER CENTER ONLY)
BASO#: 0 10*3/uL (ref 0.0–0.2)
BASO%: 0.6 % (ref 0.0–2.0)
EOS ABS: 0.2 10*3/uL (ref 0.0–0.5)
EOS%: 4.8 % (ref 0.0–7.0)
HCT: 39.3 % (ref 34.8–46.6)
HEMOGLOBIN: 13.1 g/dL (ref 11.6–15.9)
LYMPH#: 2 10*3/uL (ref 0.9–3.3)
LYMPH%: 42 % (ref 14.0–48.0)
MCH: 31.3 pg (ref 26.0–34.0)
MCHC: 33.3 g/dL (ref 32.0–36.0)
MCV: 94 fL (ref 81–101)
MONO#: 0.3 10*3/uL (ref 0.1–0.9)
MONO%: 6.9 % (ref 0.0–13.0)
NEUT#: 2.2 10*3/uL (ref 1.5–6.5)
NEUT%: 45.7 % (ref 39.6–80.0)
Platelets: 170 10*3/uL (ref 145–400)
RBC: 4.18 10*6/uL (ref 3.70–5.32)
RDW: 13.2 % (ref 11.1–15.7)
WBC: 4.8 10*3/uL (ref 3.9–10.0)

## 2016-02-06 LAB — LACTATE DEHYDROGENASE: LDH: 151 U/L (ref 125–245)

## 2016-02-06 NOTE — Progress Notes (Signed)
Hematology and Oncology Follow Up Visit  Christina Kelly [id number] 1966/07/14 50 y.o. 02/06/2016   Principle Diagnosis:   Chronic phase CML-major molecular remission  Current Therapy:    Observation     Interim History:  [name] is back for followup. A lot of Sprycel. She is enjoying being off Sprycel.  Christina Kelly only complaint is then when she lies on Christina Kelly left side, she has some heart fluttering. This has been going on for a while. She does not talk to Christina Kelly family doctor about this.  I do know she might have mitral prolapse. I think it would worthwhile checking this out. As such, an echocardiogram I think would be indicated. I talked to Christina Kelly about this. She is okay with this.  She has had no fever. She has had no rashes. She did have a recent spell of psoriasis. He uses steroid cream which apparently has resolved this. The rash was on Christina Kelly sides.  She's had no change in bowel or bladder habits.  Christina Kelly last mammogram was about a year ago. She says that she will have another one.  Christina Kelly appetite has been good.  Christina Kelly son is doing well. He is doing a little better in school.  They will be going to [facility] I think over spring break. He is never been there. She is foreword to taking him there.   Overall, Christina Kelly performance status is ECOG 0. Medications:  Current outpatient prescriptions:  .  ALPRAZolam (XANAX) 0.25 MG tablet, Take 0.125 mg by mouth at bedtime as needed for sleep., Disp: , Rfl:  .  estradiol (ESTRACE) 2 MG tablet, , Disp: , Rfl:  .  fluticasone (FLONASE) 50 MCG/ACT nasal spray, Place 1 spray into both nostrils daily., Disp: , Rfl:  .  ibuprofen (ADVIL,MOTRIN) 200 MG tablet, Take 200 mg by mouth as needed., Disp: , Rfl:  .  Melatonin 1 MG CAPS, Take 1 mg by mouth., Disp: , Rfl:  .  MINIVELLE 0.1 MG/24HR patch, APPLY 1 PATCH TWICE WEEKLY AS DIRECTED., Disp: , Rfl: 12 .  Multiple Vitamin (MULTIVITAMIN WITH MINERALS) TABS, Take 1 tablet by mouth daily., Disp: , Rfl:  .   Triamcinolone Acetonide (TRIAMCINOLONE 0.1 % CREAM : EUCERIN) CREA, Apply 1 application topically daily as needed. For eczema, Disp: 1 each, Rfl: 6 .  valACYclovir (VALTREX) 500 MG tablet, Take 1 tablet (500 mg total) by mouth as needed (Herpes Virus)., Disp: 90 tablet, Rfl: 3 .  Witch Hazel (PREPARATION H FOR WOMEN) 20 % PADS, Apply topically as needed., Disp: , Rfl:   Allergies:  Allergies  Allergen Reactions  . Ciprofloxacin     rash  . Levaquin [Levofloxacin]     rash    Past Medical History, Surgical history, Social history, and Family History were reviewed and updated.  Review of Systems: As above  Physical Exam:  height is 5\' 10"  (1.778 m) and weight is 180 lb (81.647 kg). Christina Kelly oral temperature is 97.4 F (36.3 C). Christina Kelly blood pressure is 102/67 and Christina Kelly pulse is 53. Christina Kelly respiration is 16.   Well-developed and well-nourished white female. Head and neck exam shows no ocular or oral lesions. I looked into Christina Kelly ears. Both tympanic membranes were clean with a good light reflex. She has no adenopathy. There is some slight pharyngeal erythema. Lungs are clear. Cardiac exam regular rate and rhythm with no murmurs rubs or bruits. Abdomen is soft. There are no fluid wave. There is no palpable liver or spleen tip.  She has well-healed laparoscopic wounds. Back exam shows no tenderness over the spine, ribs or hips. Extremities shows no clubbing, cyanosis or edema. Neurological exam is nonfocal. Skin exam shows focal areas of macular lesions. There is no blister associated with this. There is no pustules. They're slightly raised.  Lab Results  Component Value Date   WBC 4.8 02/06/2016   HGB 13.1 02/06/2016   HCT 39.3 02/06/2016   MCV 94 02/06/2016   PLT 170 02/06/2016     Chemistry      Component Value Date/Time   NA 141 02/06/2016 0836   NA 140 10/11/2015 0856   NA 139 06/07/2015 0839   K 4.6 02/06/2016 0836   K 4.5 10/11/2015 0856   K 4.8 06/07/2015 0839   CL 106 02/06/2016 0836    CL 103 06/07/2015 0839   CO2 30 02/06/2016 0836   CO2 25 10/11/2015 0856   CO2 25 06/07/2015 0839   BUN 12 02/06/2016 0836   BUN 15.5 10/11/2015 0856   BUN 18 06/07/2015 0839   CREATININE 0.9 02/06/2016 0836   CREATININE 1.0 10/11/2015 0856   CREATININE 0.93 06/07/2015 0839      Component Value Date/Time   CALCIUM 9.8 02/06/2016 0836   CALCIUM 9.6 10/11/2015 0856   CALCIUM 9.6 06/07/2015 0839   ALKPHOS 52 02/06/2016 0836   ALKPHOS 74 10/11/2015 0856   ALKPHOS 62 06/07/2015 0839   AST 24 02/06/2016 0836   AST 19 10/11/2015 0856   AST 16 06/07/2015 0839   ALT 21 02/06/2016 0836   ALT 15 10/11/2015 0856   ALT 15 06/07/2015 0839   BILITOT 0.70 02/06/2016 0836   BILITOT 0.38 10/11/2015 0856   BILITOT 0.4 06/07/2015 0839         Impression and Plan: Christina Kelly is a 50 year old female with chronic phase CML. She was diagnosed back in May of 2014.. She's been a major molecular remission.  He stopped Christina Kelly Sprycel in August 2016. She is a well off Sprycel. Christina Kelly body like is a little bit better.  We will see what Christina Kelly BCR/ ABL ratio looks like. I looked at Christina Kelly blood on the microscope and everything looks okay.  She has good maturation of Christina Kelly white cells. I do not see any nucleated red cells. I do not see any abnormal platelets.  We will see what the echocardiogram shows. If there is some abnormalities, then she will have to go to cardiology.  I spent about 25- 30 minutes with Christina Kelly today.  We will plan to get Christina Kelly back in 4 months.  Christina Napoleon, MD 4/3/201710:28 AM

## 2016-02-22 ENCOUNTER — Ambulatory Visit (HOSPITAL_BASED_OUTPATIENT_CLINIC_OR_DEPARTMENT_OTHER)
Admission: RE | Admit: 2016-02-22 | Discharge: 2016-02-22 | Disposition: A | Discharge status: Home / Self Care | Payer: BLUE CROSS/BLUE SHIELD | Source: Ambulatory Visit | Attending: Hematology & Oncology | Admitting: Hematology & Oncology

## 2016-02-22 ENCOUNTER — Telehealth: Payer: Self-pay | Admitting: *Deleted

## 2016-02-22 DIAGNOSIS — I498 Other specified cardiac arrhythmias: Secondary | ICD-10-CM

## 2016-02-22 DIAGNOSIS — R012 Other cardiac sounds: Secondary | ICD-10-CM | POA: Diagnosis present

## 2016-02-22 DIAGNOSIS — C9210 Chronic myeloid leukemia, BCR/ABL-positive, not having achieved remission: Secondary | ICD-10-CM

## 2016-02-22 NOTE — Telephone Encounter (Signed)
Called patient and informed her of results. She states her symptoms have worsened. She c/o continued flutter, with chest pain and tightness. She states she is short of breath at times. She is concerned that there is a serious issue despite the negative echo.   She has requested a referral to a cardiologist. She also requests that a copy of her echo be faxed to her PCP.   Spoke to Dr Marin Olp and he would like patient to request that her PCP refer the patient to cardiology. Echo results faxed to Dr Dema Severin.      Call - let her know that the echo is perfect and the she is still in remission for the [facility]!! Christina Kelly

## 2016-02-22 NOTE — Progress Notes (Signed)
*  PRELIMINARY RESULTS* Echocardiogram 2D Echocardiogram has been performed.  Leavy Cella 02/22/2016, 11:13 AM

## 2016-03-12 ENCOUNTER — Encounter: Payer: Self-pay | Admitting: Hematology & Oncology

## 2016-03-26 ENCOUNTER — Encounter: Payer: Self-pay | Admitting: Cardiology

## 2016-03-28 ENCOUNTER — Encounter: Payer: Self-pay | Admitting: Cardiology

## 2016-03-28 ENCOUNTER — Other Ambulatory Visit: Payer: Self-pay | Admitting: Cardiology

## 2016-03-28 ENCOUNTER — Ambulatory Visit (INDEPENDENT_AMBULATORY_CARE_PROVIDER_SITE_OTHER): Payer: BLUE CROSS/BLUE SHIELD | Admitting: Cardiology

## 2016-03-28 VITALS — BP 110/70 | HR 52 | Ht 71.5 in | Wt 177.8 lb

## 2016-03-28 DIAGNOSIS — R002 Palpitations: Secondary | ICD-10-CM

## 2016-03-28 DIAGNOSIS — I498 Other specified cardiac arrhythmias: Secondary | ICD-10-CM | POA: Diagnosis not present

## 2016-03-28 DIAGNOSIS — C9210 Chronic myeloid leukemia, BCR/ABL-positive, not having achieved remission: Secondary | ICD-10-CM

## 2016-03-28 DIAGNOSIS — I493 Ventricular premature depolarization: Secondary | ICD-10-CM

## 2016-03-28 DIAGNOSIS — C9211 Chronic myeloid leukemia, BCR/ABL-positive, in remission: Secondary | ICD-10-CM
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
## 2016-03-28 NOTE — Patient Instructions (Addendum)
Medication Instructions:  Your physician recommends that you continue on your current medications as directed. Please refer to the Current Medication list given to you today.   Labwork: None ordered  Testing/Procedures: Your physician has recommended that you wear a holter monitor. Holter monitors are medical devices that record the heart's electrical activity. Doctors most often use these monitors to diagnose arrhythmias. Arrhythmias are problems with the speed or rhythm of the heartbeat. The monitor is a small, portable device. You can wear one while you do your normal daily activities. This is usually used to diagnose what is causing palpitations/syncope (passing out).    Follow-Up: Your physician recommends that you schedule a follow-up appointment in: AS NEEDED   Any Other Special Instructions Will Be Listed Below (If Applicable).     If you need a refill on your cardiac medications before your next appointment, please call your pharmacy.

## 2016-03-29 ENCOUNTER — Ambulatory Visit (INDEPENDENT_AMBULATORY_CARE_PROVIDER_SITE_OTHER): Payer: BLUE CROSS/BLUE SHIELD

## 2016-03-29 DIAGNOSIS — I493 Ventricular premature depolarization: Secondary | ICD-10-CM

## 2016-03-29 DIAGNOSIS — R002 Palpitations: Secondary | ICD-10-CM

## 2016-04-11 ENCOUNTER — Encounter (HOSPITAL_COMMUNITY): Payer: Self-pay

## 2016-04-11 ENCOUNTER — Emergency Department (HOSPITAL_COMMUNITY)
Admission: EM | Admit: 2016-04-11 | Discharge: 2016-04-11 | Disposition: A | Discharge status: Home / Self Care | Payer: BLUE CROSS/BLUE SHIELD | Attending: Emergency Medicine | Admitting: Emergency Medicine

## 2016-04-11 DIAGNOSIS — Z203 Contact with and (suspected) exposure to rabies: Secondary | ICD-10-CM | POA: Diagnosis present

## 2016-04-11 DIAGNOSIS — Z2914 Encounter for prophylactic rabies immune globin: Secondary | ICD-10-CM | POA: Diagnosis not present

## 2016-04-11 DIAGNOSIS — Z23 Encounter for immunization: Secondary | ICD-10-CM

## 2016-04-11 MED ORDER — RABIES IMMUNE GLOBULIN 150 UNIT/ML IM INJ
20.0000 [IU]/kg | INJECTION | Freq: Once | INTRAMUSCULAR | Status: AC
  Administered 2016-04-11: 1575 [IU] via INTRAMUSCULAR
  Filled 2016-04-11: qty 10.5

## 2016-04-11 MED ORDER — RABIES VIRUS VACCINE, HDC IM INJ
1.0000 mL | INJECTION | Freq: Once | INTRAMUSCULAR | Status: AC
  Administered 2016-04-11: 1 mL via INTRAMUSCULAR
  Filled 2016-04-11: qty 1

## 2016-04-11 NOTE — Discharge Instructions (Signed)
Please follow up with [facility] as directed below                                    [place]  Patient's Name: Christina Kelly                     Original Order Date:04/11/2016  Medical Record Number: [id number]  ED Physician: Jola Schmidt, MD Primary Diagnosis: Rabies Exposure       PCP: Vidal Schwalbe, MD  Patient Phone Number: (home) [phone] (home)    (cell)  Telephone Information:  Mobile [phone]    (work) There is no work phone number on file. Species of Animal:     You have been seen in the Emergency Department for a possible rabies exposure. It's very important you return for the additional vaccine doses.  Please call the clinic listed below for hours of operation.   Clinic that will administer your rabies vaccines:    DAY 0:  04/11/2016      DAY 3:  04/14/2016       DAY 7:  04/18/2016     DAY 14:  04/25/2016         Rabies Vaccine: What You Need to Know WHAT IS RABIES?  Rabies is a serious disease. It is caused by a virus.  Rabies is mainly a disease of animals. Humans get rabies when they are bitten by infected animals.  At first there might not be any symptoms. But weeks, or even years after a bite, rabies can cause pain, fatigue, headaches, fever, and irritability. These are followed by seizures, hallucinations, and paralysis. Human rabies is almost always fatal.  Wild animals, especially bats, are the most common source of human rabies infection in the [place]. Skunks, raccoons, dogs, cats, coyotes, foxes, and other mammals can also transmit the disease.  Human rabies is rare in the [place]. There have been only 21 cases diagnosed since 1990. However, between 16,000 and 39,000 people are vaccinated each year as a precaution after animal bites. Also, rabies is far more common in other parts of the world, with about 40,000 to 70,000 rabies-related deaths worldwide each year. Bites from unvaccinated dogs cause most of  these cases. Rabies vaccine can prevent rabies. RABIES VACCINE  Rabies vaccine is given to people at high risk of rabies to protect them if they are exposed. It can also prevent the disease if it is given to a person after they have been exposed.  Rabies vaccine is made from killed rabies virus. It cannot cause rabies. WHO SHOULD GET RABIES VACCINE AND WHEN? Preventive Vaccination (No Exposure)  People at high risk of exposure to rabies, such as veterinarians, Insurance account manager, rabies laboratory workers, spelunkers, and rabies biologics production workers should be offered rabies vaccine.  The vaccine should also be considered for:  People whose activities bring them into frequent contact with rabies virus or with possibly rabid animals.  International travelers who are likely to come in contact with animals in parts of the world where rabies is common.  The pre-exposure schedule for rabies vaccination is 3 doses, given at the following times:  Dose 1: As appropriate.  Dose 2: 7 days after Dose 1.  Dose 3: 21 days or 28 days after Dose 1.  For laboratory workers and others who may be repeatedly exposed to rabies virus, periodic testing  for immunity is recommended and booster doses should be given as needed. (Testing or booster doses are not recommended for travelers). Ask your doctor for details. Vaccination After an Exposure Anyone who has been bitten by an animal, or who otherwise may have been exposed to rabies, should clean the wound and see a doctor immediately. The doctor will determine if they need to be vaccinated. A person who is exposed and has never been vaccinated against rabies should get 4 doses of rabies vaccine: one dose right away and additional doses on the 3rd, 7th, and 14th days. They should also get another shot called Rabies Immune Globulin at the same time as the first dose.  A person who has been previously vaccinated should get 2 doses of rabies vaccine: one  right away and another on the 3rd day. Rabies Immune Globulin is not needed. TELL YOUR DOCTOR IF: Talk with a doctor before getting rabies vaccine if you:  Ever had a serious (life-threatening) allergic reaction to a previous dose of rabies vaccine or to any component of the vaccine; tell your doctor if you have any severe allergies.  Have a weakened immune system because of:  HIV, AIDS, or another disease that affects the immune system.  Treatment with drugs that affect the immune system, such as steroids.  Cancer or cancer treatment with radiation or drugs. If you have a minor illness, such as a cold, you can be vaccinated. If you are moderately or severely ill, you should probably wait until you recover before getting a routine (non-exposure) dose of rabies vaccine. If you have been exposed to rabies virus, you should get the vaccine regardless of any other illnesses you may have. WHAT ARE THE RISKS FROM RABIES VACCINE? A vaccine, like any medicine, is capable of causing serious problems, such as severe allergic reactions. The risk of a vaccine causing serious harm, or death, is extremely small. Serious problems from rabies vaccine are very rare.  Mild problems:  Soreness, redness, swelling, or itching where the shot was given (30% to 74%).  Headache, nausea, abdominal pain, muscle aches, or dizziness (5% to 40%). Moderate problems:  Hives, pain in the joints, or fever (about 6% of booster doses).  Other nervous system disorders, such as Guillain-Barr Syndrome (GBS), have been reported after rabies vaccine, but this happens so rarely that it is not known whether they are related to the vaccine. Note: Several brands of rabies vaccine are available in the [place], and reactions may vary between brands. Your provider can give you more information about a particular brand. WHAT IF THERE IS A SERIOUS REACTION? What should I look for? Look for anything that concerns you, such as  signs of a severe allergic reaction, very high fever, or behavior changes.  Signs of a severe allergic reaction can include hives, swelling of the face and throat, difficulty breathing, a fast heartbeat, dizziness, and weakness. These would start a few minutes to a few hours after the vaccination. What should I do?  If you think it is a severe allergic reaction or other emergency that cannot wait, call 911 or get the person to the nearest hospital. Otherwise, call your doctor.  Afterward, the reaction should be reported to the Vaccine Adverse Event Reporting System (VAERS). Your doctor might file this report, or you can do it yourself through the VAERS website at www.vaers.SamedayNews.es or by calling 818-406-3438. VAERS is only for reporting reactions. They do not give medical advice. HOW CAN I LEARN MORE?  Ask your doctor.  Call your local or state health department.  Contact the Centers for Disease Control and Prevention (CDC):  Visit the CDC rabies website at [place].no CDC Rabies Vaccine VIS (08/10/08)   This information is not intended to replace advice given to you by your health care provider. Make sure you discuss any questions you have with your health care provider.   Document Released: 08/19/2006 Document Revised: 03/08/2015 Document Reviewed: 02/11/2013 Elsevier Interactive Patient Education [employer].

## 2016-04-11 NOTE — ED Provider Notes (Signed)
CSN: [id number]     Arrival date & time 04/11/16  1108 History  By signing my name below, I, Eustaquio Maize, attest that this documentation has been prepared under the direction and in the presence of Nashya Garlington, PA-C.  Electronically Signed: Eustaquio Maize, ED Scribe. 04/11/2016. 12:29 PM.   Chief Complaint  Patient presents with  . Bat Exposure    The history is provided by the patient. No language interpreter was used.    HPI Comments: Christina Kelly is a 50 y.o. female who presents to the Emergency Department complaining of possible bat exposure that occurred 2 days ago. Pt reports that they found a baby bat in the bathroom in the morning and assumes it must have been in the house over night. Bat appeared ill. Pt caught the bat and let it outside without issues. She mentions that another bat which she assumes is the mother of the baby bat also flew into her house last night but she stayed in a backroom with the doors closed until the bat flew out. No known bites on body but pt is here for rabies vaccine. Denies any other associated symptoms.   Past Medical History  Diagnosis Date  . Ovarian cyst     bilateral  . 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 Laparoscopic endometriosis fulguration  2003  . Bunionectomy  60yrs ago    x4  . Bone marrow aspirate and biopsy wiith lumbar puncture  May 2014  . Laparoscopic assisted vaginal hysterectomy N/A 12/31/2013    Procedure: LAPAROSCOPIC ASSISTED VAGINAL HYSTERECTOMY;  Surgeon: Luz Lex, MD;  Location: [facility];  Service: Gynecology;  Laterality: N/A;  . Salpingoophorectomy Bilateral 12/31/2013    Procedure: SALPINGO OOPHORECTOMY;  Surgeon: Luz Lex, MD;  Location: [facility];  Service: Gynecology;  Laterality: Bilateral;   Family History  Problem Relation Age of Onset  . Heart attack Father    Social History  Substance Use Topics  . Smoking status: Never Smoker    . Smokeless tobacco: Never Used     Comment: never used tobacco  . Alcohol Use: 0.0 oz/week    0 Standard drinks or equivalent per week     Comment: occasionally   OB History    No data available     Review of Systems  Constitutional: Negative for fever.  Skin: Negative for wound.   Allergies  Ciprofloxacin and Levaquin  Home Medications   Prior to Admission medications   Medication Sig Start Date End Date Taking? Authorizing Provider  ALPRAZolam (XANAX) 0.25 MG tablet Take 0.125 mg by mouth at bedtime as needed for sleep.    Historical Provider, MD  estradiol (ESTRACE) 2 MG tablet Take 2 mg by mouth daily.  01/16/16   Historical Provider, MD  fluticasone (FLONASE) 50 MCG/ACT nasal spray Place 1 spray into both nostrils daily.    Historical Provider, MD  ibuprofen (ADVIL,MOTRIN) 200 MG tablet Take 200 mg by mouth as needed for headache or moderate pain.     Historical Provider, MD  MINIVELLE 0.1 MG/24HR patch APPLY 1 PATCH TWICE WEEKLY AS DIRECTED. 09/14/15   Historical Provider, MD  Multiple Vitamin (MULTIVITAMIN WITH MINERALS) TABS Take 1 tablet by mouth daily.    Historical Provider, MD  Triamcinolone Acetonide (TRIAMCINOLONE 0.1 % CREAM : EUCERIN) CREA Apply 1 application topically daily as needed. For eczema 06/07/15   [name], MD  triamterene-hydrochlorothiazide (MAXZIDE-25) 37.5-25 MG tablet  Take 1 tablet by mouth 2 (two) times daily as needed (fluid retention).    Historical Provider, MD  valACYclovir (VALTREX) 500 MG tablet Take 1 tablet (500 mg total) by mouth as needed (Herpes Virus). 07/09/14   [name], MD  Witch Hazel (PREPARATION H FOR WOMEN) 20 % PADS Apply topically as needed.    Historical Provider, MD   BP 102/77 mmHg  Pulse 61  Temp(Src) 98.2 F (36.8 C) (Oral)  Resp 16  Wt 177 lb 1.6 oz (80.332 kg)  SpO2 98%   Physical Exam  Constitutional: She is oriented to person, place, and time. She appears well-developed and well-nourished. No distress.   HENT:  Head: Normocephalic and atraumatic.  Eyes: Conjunctivae and EOM are normal.  Neck: Neck supple. No tracheal deviation present.  Cardiovascular: Normal rate.   Pulmonary/Chest: Effort normal. No respiratory distress.  Musculoskeletal: Normal range of motion.  Neurological: She is alert and oriented to person, place, and time.  Skin: Skin is warm and dry.  Psychiatric: She has a normal mood and affect. Her behavior is normal.  Nursing note and vitals reviewed.   ED Course  Procedures (including critical care time)  DIAGNOSTIC STUDIES: Oxygen Saturation is 98% on RA, normal by my interpretation.    COORDINATION OF CARE: 12:11 PM-Discussed treatment plan which includes rabies injection with pt at bedside and pt agreed to plan.    MDM   Final diagnoses:  Need for prophylactic vaccination against rabies   Pt with bat exposure, found ill appearing bat in the house.  Pt scooped bat up and took it outside. I reviewed CDC recommendations which recommend prophylactic treatment if unknown time of exposure or potential contact during sleep either with a bite, scratch, mucous membranes. I discussed with pt, since very low risk for rabies transmission, but decided to vaccinate given potential transmission risk still exists. Pt received vaccine and immunoglobulin in ED. She will follow up day 3, 7,14 with urgent care for further vaccination.    Filed Vitals:   04/11/16 1129 04/11/16 1146  BP: 102/77   Pulse: 61   Temp: 98.2 F (36.8 C)   TempSrc: Oral   Resp: 16   Weight:  80.332 kg  SpO2: 98%     I personally performed the services described in this documentation, which was scribed in my presence. The recorded information has been reviewed and is accurate.    Jeannett Senior, PA-C 04/11/16 [place], MD 04/11/16 [phone]

## 2016-04-11 NOTE — ED Notes (Addendum)
Pt presents after potential bat exposure x 2 days ago.  Pt reports "a baby bat was in out bathroom."  Pt was sent to ED for rabies vaccinations.

## 2016-04-14 ENCOUNTER — Ambulatory Visit (HOSPITAL_COMMUNITY)
Admission: EM | Admit: 2016-04-14 | Discharge: 2016-04-14 | Disposition: A | Discharge status: Home / Self Care | Payer: BLUE CROSS/BLUE SHIELD | Attending: Family Medicine | Admitting: Family Medicine

## 2016-04-14 ENCOUNTER — Encounter (HOSPITAL_COMMUNITY): Payer: Self-pay | Admitting: Emergency Medicine

## 2016-04-14 DIAGNOSIS — Z203 Contact with and (suspected) exposure to rabies: Secondary | ICD-10-CM | POA: Diagnosis not present

## 2016-04-14 MED ORDER — RABIES VACCINE, PCEC IM SUSR
1.0000 mL | Freq: Once | INTRAMUSCULAR | Status: AC
  Administered 2016-04-14: 1 mL via INTRAMUSCULAR

## 2016-04-14 NOTE — Discharge Instructions (Signed)
Return on 6/14 for 3rd rabies vaccination

## 2016-04-14 NOTE — ED Notes (Signed)
Here for day 3 rabies vaccination (#2)... Voices no new concerns.

## 2016-04-18 ENCOUNTER — Encounter (HOSPITAL_COMMUNITY): Payer: Self-pay | Admitting: Emergency Medicine

## 2016-04-18 ENCOUNTER — Ambulatory Visit (HOSPITAL_COMMUNITY)
Admission: EM | Admit: 2016-04-18 | Discharge: 2016-04-18 | Disposition: A | Discharge status: Home / Self Care | Payer: BLUE CROSS/BLUE SHIELD | Attending: Emergency Medicine | Admitting: Emergency Medicine

## 2016-04-18 DIAGNOSIS — Z203 Contact with and (suspected) exposure to rabies: Secondary | ICD-10-CM

## 2016-04-18 MED ORDER — RABIES VACCINE, PCEC IM SUSR
1.0000 mL | Freq: Once | INTRAMUSCULAR | Status: AC
  Administered 2016-04-18: 1 mL via INTRAMUSCULAR

## 2016-04-18 MED ORDER — RABIES VACCINE, PCEC IM SUSR
INTRAMUSCULAR | Status: AC
Start: 2016-04-18 — End: 2016-04-18
  Filled 2016-04-18: qty 1

## 2016-04-18 NOTE — Discharge Instructions (Signed)
Please return to the [facility] on 04/25/2016 for your final injection in the rabies series.

## 2016-04-18 NOTE — ED Notes (Signed)
The patient presented to the [facility] with a complaint of needing the 3rd in the rabies vaccination series.

## 2016-04-25 ENCOUNTER — Encounter (HOSPITAL_COMMUNITY): Payer: Self-pay | Admitting: Emergency Medicine

## 2016-04-25 ENCOUNTER — Ambulatory Visit (HOSPITAL_COMMUNITY)
Admission: EM | Admit: 2016-04-25 | Discharge: 2016-04-25 | Disposition: A | Discharge status: Home / Self Care | Payer: BLUE CROSS/BLUE SHIELD | Attending: Emergency Medicine | Admitting: Emergency Medicine

## 2016-04-25 DIAGNOSIS — Z203 Contact with and (suspected) exposure to rabies: Secondary | ICD-10-CM | POA: Diagnosis not present

## 2016-04-25 MED ORDER — RABIES VACCINE, PCEC IM SUSR
1.0000 mL | Freq: Once | INTRAMUSCULAR | Status: AC
  Administered 2016-04-25: 1 mL via INTRAMUSCULAR

## 2016-04-25 MED ORDER — RABIES VACCINE, PCEC IM SUSR
INTRAMUSCULAR | Status: AC
  Filled 2016-04-25: qty 1

## 2016-04-25 NOTE — ED Notes (Signed)
The patient presented to the [facility] to receive the final injection in the rabies vaccination series.

## 2016-04-25 NOTE — ED Notes (Signed)
Waiting on main pharmacy to bring Imovax.

## 2016-04-25 NOTE — Discharge Instructions (Signed)
Return to Urgent Care if any problems develop.

## 2016-06-11 ENCOUNTER — Other Ambulatory Visit (HOSPITAL_BASED_OUTPATIENT_CLINIC_OR_DEPARTMENT_OTHER): Payer: BLUE CROSS/BLUE SHIELD

## 2016-06-11 ENCOUNTER — Encounter: Payer: Self-pay | Admitting: Hematology & Oncology

## 2016-06-11 ENCOUNTER — Ambulatory Visit (HOSPITAL_BASED_OUTPATIENT_CLINIC_OR_DEPARTMENT_OTHER): Payer: BLUE CROSS/BLUE SHIELD | Admitting: Hematology & Oncology

## 2016-06-11 VITALS — BP 100/67 | HR 55 | Temp 97.6°F | Resp 18 | Ht 71.5 in | Wt 177.0 lb

## 2016-06-11 DIAGNOSIS — I498 Other specified cardiac arrhythmias: Secondary | ICD-10-CM

## 2016-06-11 DIAGNOSIS — C9210 Chronic myeloid leukemia, BCR/ABL-positive, not having achieved remission: Secondary | ICD-10-CM | POA: Diagnosis not present

## 2016-06-11 LAB — CBC WITH DIFFERENTIAL (CANCER CENTER ONLY)
BASO#: 0 10*3/uL (ref 0.0–0.2)
BASO%: 0.4 % (ref 0.0–2.0)
EOS ABS: 0.2 10*3/uL (ref 0.0–0.5)
EOS%: 4.2 % (ref 0.0–7.0)
HCT: 40.3 % (ref 34.8–46.6)
HEMOGLOBIN: 13.4 g/dL (ref 11.6–15.9)
LYMPH#: 1.9 10*3/uL (ref 0.9–3.3)
LYMPH%: 39.7 % (ref 14.0–48.0)
MCH: 31.2 pg (ref 26.0–34.0)
MCHC: 33.3 g/dL (ref 32.0–36.0)
MCV: 94 fL (ref 81–101)
MONO#: 0.4 10*3/uL (ref 0.1–0.9)
MONO%: 7.9 % (ref 0.0–13.0)
NEUT%: 47.8 % (ref 39.6–80.0)
NEUTROS ABS: 2.3 10*3/uL (ref 1.5–6.5)
Platelets: 188 10*3/uL (ref 145–400)
RBC: 4.3 10*6/uL (ref 3.70–5.32)
RDW: 13 % (ref 11.1–15.7)
WBC: 4.8 10*3/uL (ref 3.9–10.0)

## 2016-06-11 LAB — CMP (CANCER CENTER ONLY)
ALBUMIN: 3.8 g/dL (ref 3.3–5.5)
ALT(SGPT): 18 U/L (ref 10–47)
AST: 22 U/L (ref 11–38)
Alkaline Phosphatase: 44 U/L (ref 26–84)
BILIRUBIN TOTAL: 0.8 mg/dL (ref 0.20–1.60)
BUN, Bld: 12 mg/dL (ref 7–22)
CHLORIDE: 106 meq/L (ref 98–108)
CO2: 29 mEq/L (ref 18–33)
CREATININE: 1.2 mg/dL (ref 0.6–1.2)
Calcium: 9.4 mg/dL (ref 8.0–10.3)
Glucose, Bld: 86 mg/dL (ref 73–118)
Potassium: 4.9 mEq/L — ABNORMAL HIGH (ref 3.3–4.7)
SODIUM: 137 meq/L (ref 128–145)
TOTAL PROTEIN: 7 g/dL (ref 6.4–8.1)

## 2016-06-11 NOTE — Progress Notes (Signed)
Hematology and Oncology Follow Up Visit  Christina Kelly [id number] 03/18/1966 50 y.o. 06/11/2016   Principle Diagnosis:   Chronic phase CML-major molecular remission  Current Therapy:    Observation     Interim History:  Ms.  Christina Kelly is back for followup. It has been a year now that she's been off Sprycel. She has done quite well. She has had a good summer. She and her partner and their son went to [place] to see family. They had a good time.   Her last BCR/ABL ratio was not detectable. We're checking this again today.   She has had no problems with cough. She does have a little bit of fatigue. There's been no change in bowel or bladder habits.   She's had no bleeding or bruising. She's had no fever.   There's been no rashes.   Overall, her performance status is ECOG 0     Medications:  Current Outpatient Prescriptions:  .  ALPRAZolam (XANAX) 0.25 MG tablet, Take 0.125 mg by mouth at bedtime as needed for sleep., Disp: , Rfl:  .  fluticasone (FLONASE) 50 MCG/ACT nasal spray, Place 1 spray into both nostrils daily., Disp: , Rfl:  .  ibuprofen (ADVIL,MOTRIN) 200 MG tablet, Take 200 mg by mouth as needed for headache or moderate pain. , Disp: , Rfl:  .  MINIVELLE 0.1 MG/24HR patch, APPLY 1 PATCH TWICE WEEKLY AS DIRECTED., Disp: , Rfl: 12 .  Multiple Vitamin (MULTIVITAMIN WITH MINERALS) TABS, Take 1 tablet by mouth daily., Disp: , Rfl:  .  Triamcinolone Acetonide (TRIAMCINOLONE 0.1 % CREAM : EUCERIN) CREA, Apply 1 application topically daily as needed. For eczema, Disp: 1 each, Rfl: 6 .  triamterene-hydrochlorothiazide (MAXZIDE-25) 37.5-25 MG tablet, Take 1 tablet by mouth 2 (two) times daily as needed (fluid retention)., Disp: , Rfl:  .  valACYclovir (VALTREX) 500 MG tablet, Take 1 tablet (500 mg total) by mouth as needed (Herpes Virus)., Disp: 90 tablet, Rfl: 3 .  Witch Hazel (PREPARATION H FOR WOMEN) 20 % PADS, Apply topically as needed., Disp: , Rfl:    Allergies:  Allergies  Allergen Reactions  . Ciprofloxacin     rash  . Levaquin [Levofloxacin]     rash    Past Medical History, Surgical history, Social history, and Family History were reviewed and updated.  Review of Systems: As above  Physical Exam:  height is 5' 11.5" (1.816 m) and weight is 177 lb (80.3 kg). Her oral temperature is 97.6 F (36.4 C). Her blood pressure is 100/67 and her pulse is 55 (abnormal). Her respiration is 18.   Well-developed and well-nourished white female. Head and neck exam shows no ocular or oral lesions. I looked into her ears. Both tympanic membranes were clean with a good light reflex. She has no adenopathy. There is some slight pharyngeal erythema. Lungs are clear. Cardiac exam regular rate and rhythm with no murmurs rubs or bruits. Abdomen is soft. There are no fluid wave. There is no palpable liver or spleen tip. She has well-healed laparoscopic wounds. Back exam shows no tenderness over the spine, ribs or hips. Extremities shows no clubbing, cyanosis or edema. Neurological exam is nonfocal. Skin exam shows focal areas of macular lesions. There is no blister associated with this. There is no pustules. They're slightly raised.  Lab Results  Component Value Date   WBC 4.8 06/11/2016   HGB 13.4 06/11/2016   HCT 40.3 06/11/2016   MCV 94 06/11/2016   PLT 188 06/11/2016  Chemistry      Component Value Date/Time   NA 137 06/11/2016 1010   NA 140 10/11/2015 0856   K 4.9 (H) 06/11/2016 1010   K 4.5 10/11/2015 0856   CL 106 06/11/2016 1010   CO2 29 06/11/2016 1010   CO2 25 10/11/2015 0856   BUN 12 06/11/2016 1010   BUN 15.5 10/11/2015 0856   CREATININE 1.2 06/11/2016 1010   CREATININE 1.0 10/11/2015 0856      Component Value Date/Time   CALCIUM 9.4 06/11/2016 1010   CALCIUM 9.6 10/11/2015 0856   ALKPHOS 44 06/11/2016 1010   ALKPHOS 74 10/11/2015 0856   AST 22 06/11/2016 1010   AST 19 10/11/2015 0856   ALT 18 06/11/2016 1010   ALT  15 10/11/2015 0856   BILITOT 0.80 06/11/2016 1010   BILITOT 0.38 10/11/2015 0856         Impression and Plan: [name] is a 50 year old female with chronic phase CML. She was diagnosed back in May of 2014.. She's been a major molecular remission.  He stopped her Sprycel in August 2016. She isDoing well off Sprycel.    We will see what her BCR/ ABL ratio looks like. I looked at her blood on the microscope and everything looks okay.  She has good maturation of her white cells. I do not see any nucleated red cells. I do not see any abnormal platelets.  I spent about 25- 30 minutes with her today. Her son was with [place] today. He is a good kid. He is quite talkative.  We will plan to get her back in 6 months.  [name], MD 8/7/201710:54 AM

## 2016-06-19 ENCOUNTER — Encounter: Payer: Self-pay | Admitting: Hematology & Oncology

## 2016-07-20 ENCOUNTER — Other Ambulatory Visit: Payer: Self-pay | Admitting: Family Medicine

## 2016-07-20 DIAGNOSIS — Z1231 Encounter for screening mammogram for malignant neoplasm of breast: Secondary | ICD-10-CM

## 2016-09-13 IMAGING — MG MM DIAG BREAST TOMO UNI LEFT
4 series · 4 of 12 positions shown · non-contrast
Comparison: 01/05/2015 and earlier.

CLINICAL DATA: Recall from screening mammography with
tomosynthesis, possible mass or distortion in the upper outer
quadrant of the left breast.

EXAM:
DIGITAL DIAGNOSTIC UNILATERAL LEFT MAMMOGRAM WITH 3D TOMOSYNTHESIS
AND CAD

[L MLO]
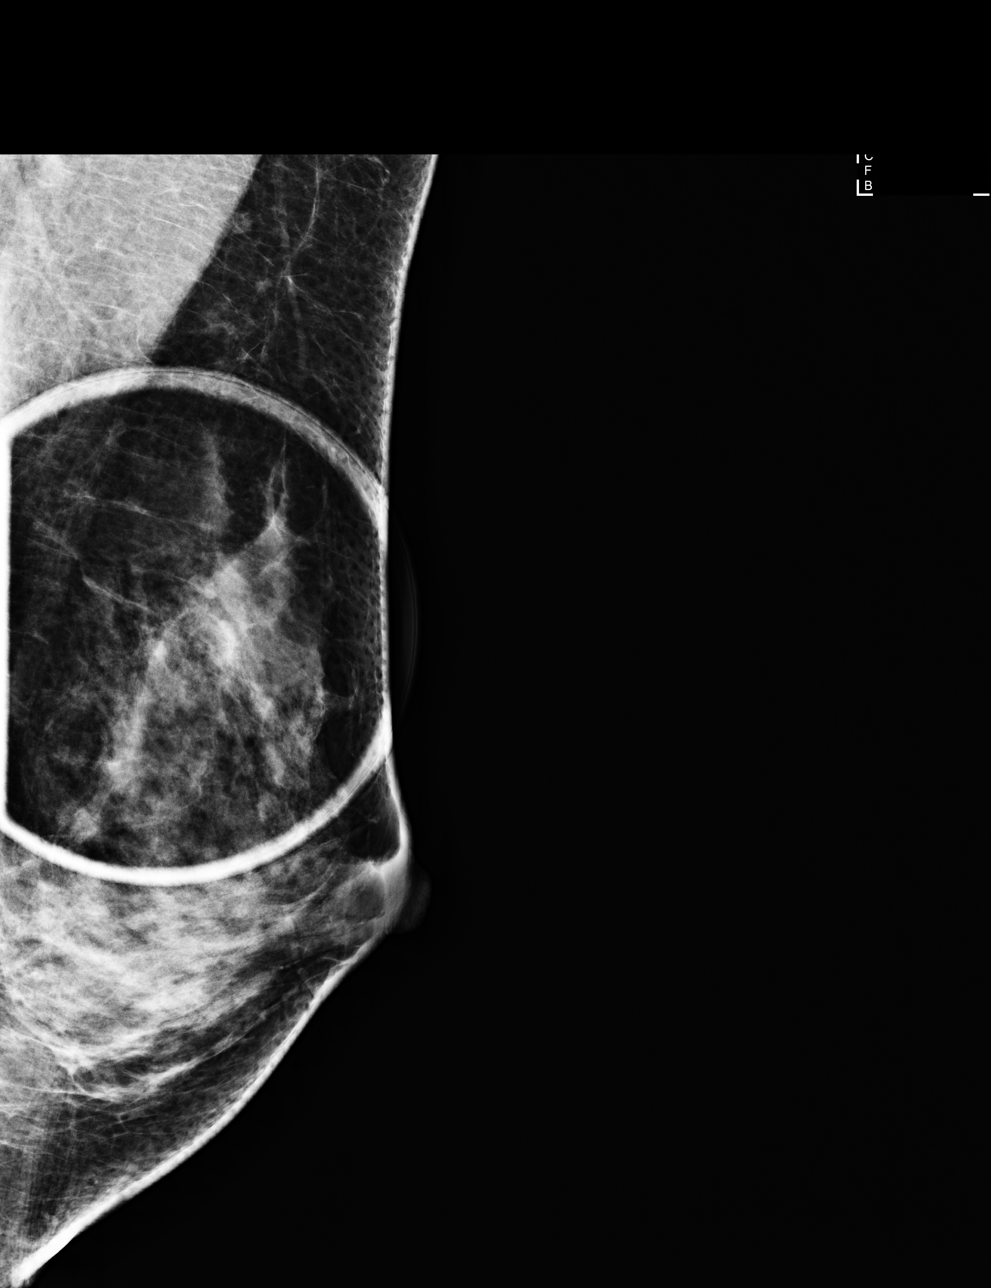

[L CC]
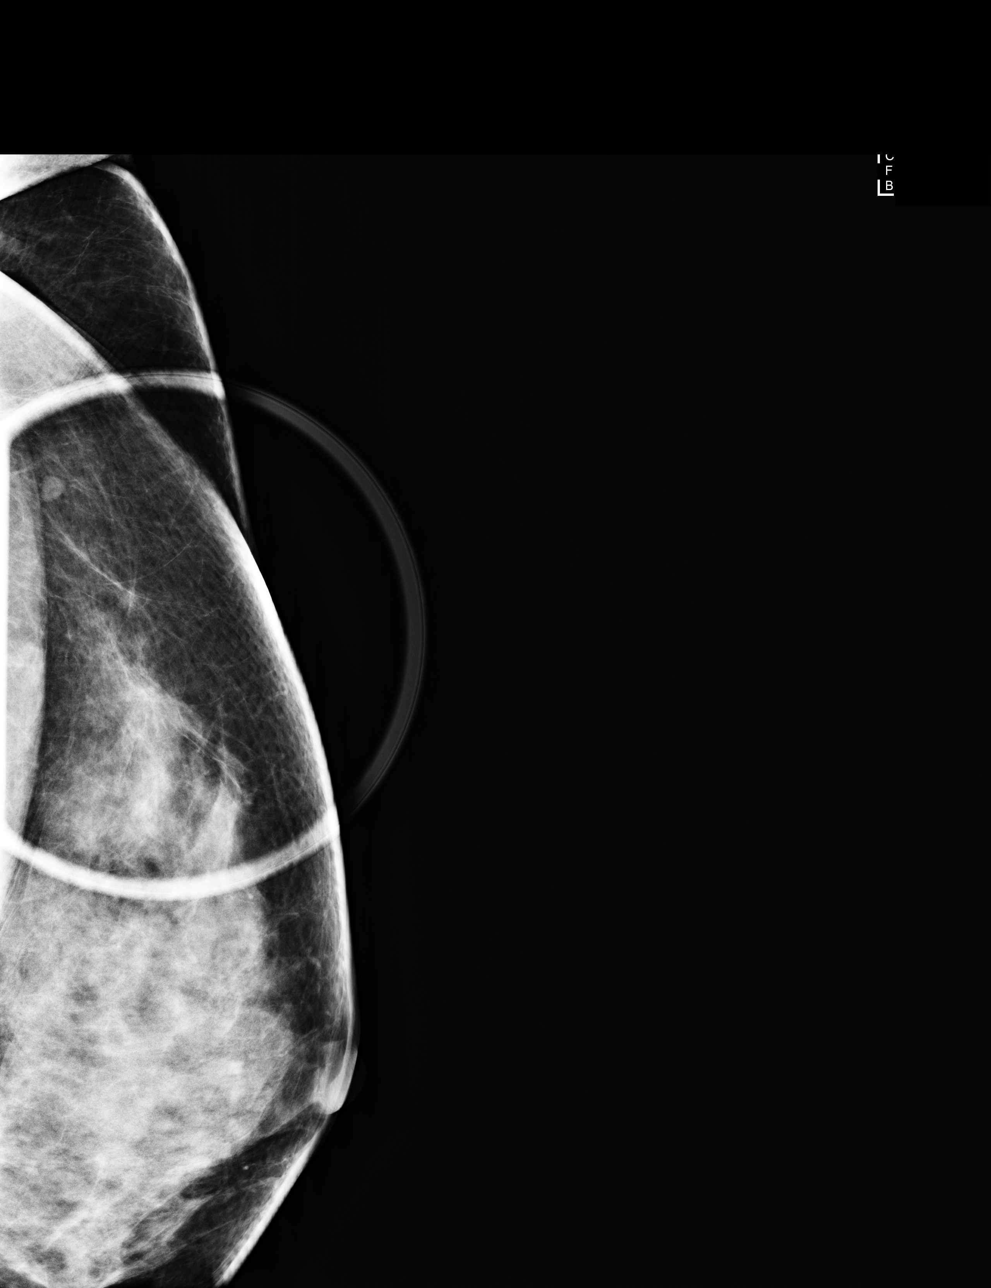

[L MLO tomo · tomo slice 31/60.0]
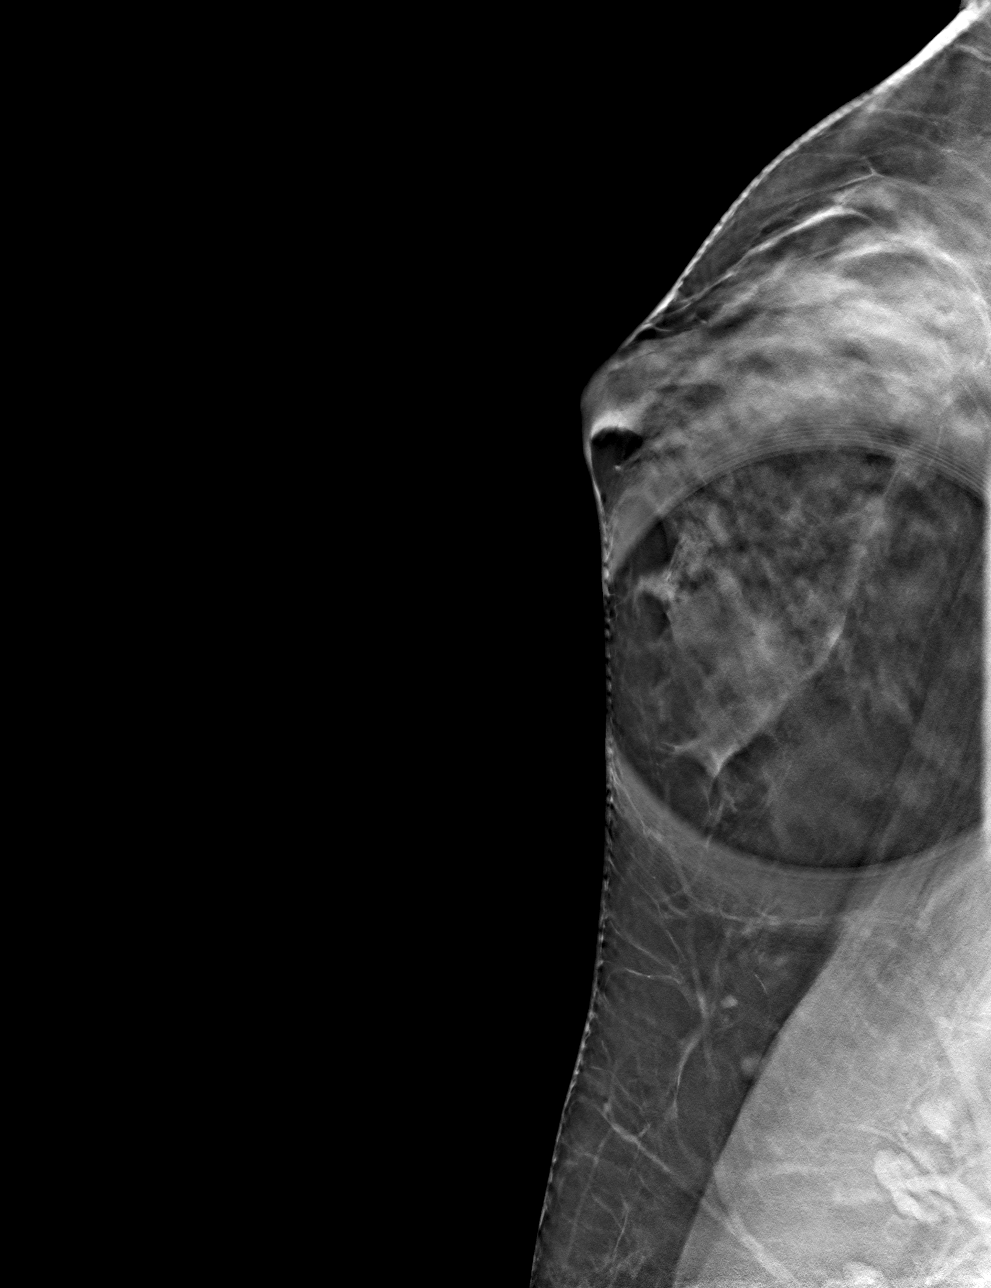

[L CC tomo · tomo slice 38/75.0]
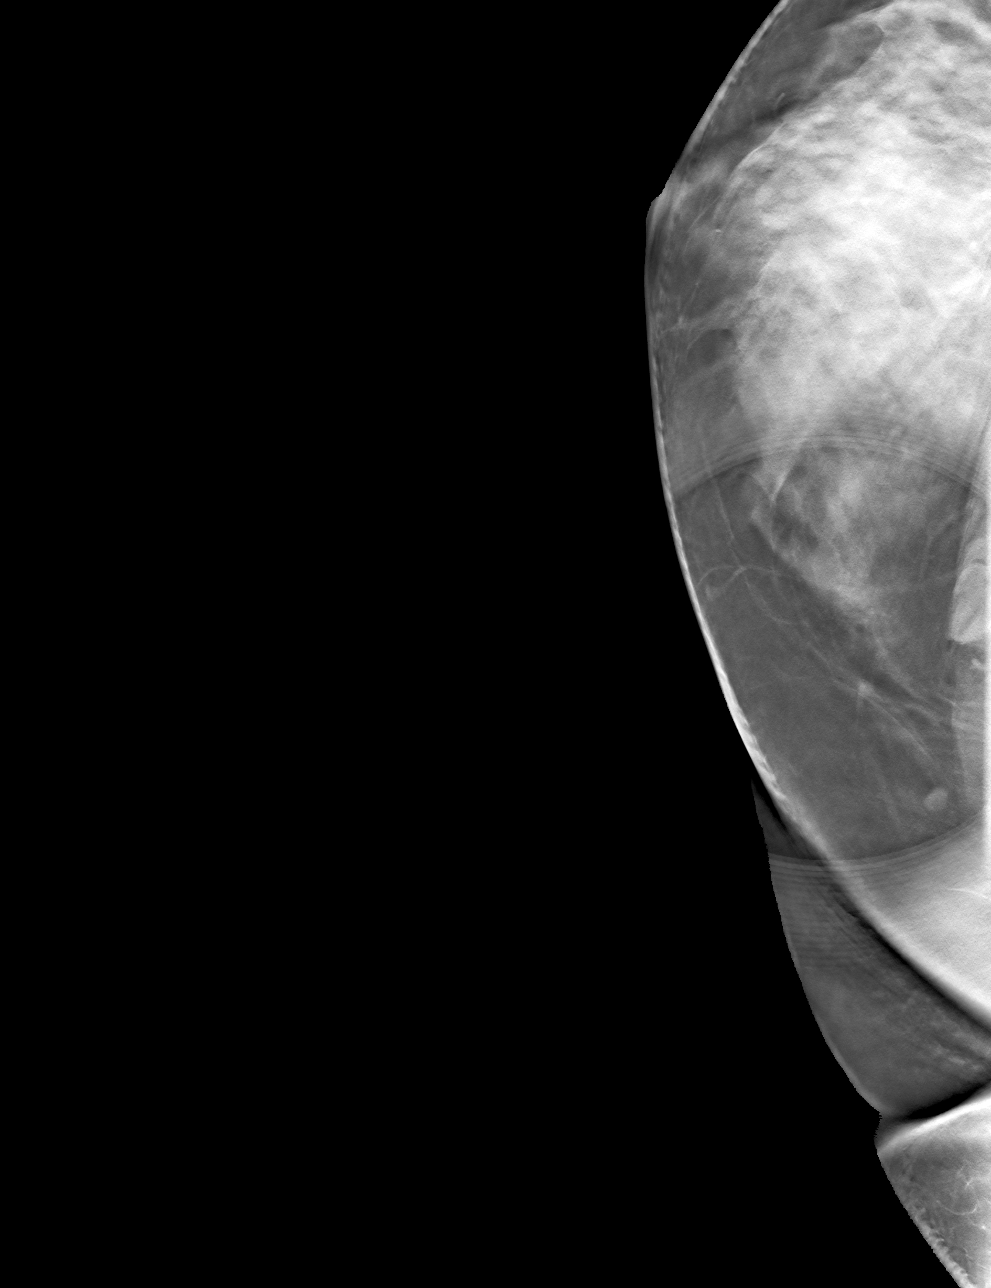

[4 of 12 positions shown; findings below may reference images not displayed]

ACR Breast Density Category c: The breast tissue is heterogeneously
dense, which may obscure small masses.
FINDINGS: Spot compression CC and MLO views of the area of concern in the
upper outer left breast were performed with tomosynthesis. No
persistent architectural distortion or mass in the area of concern
on screening mammography.

Mammographic images were processed with CAD.
IMPRESSION: No mammographic evidence of malignancy, left breast.

RECOMMENDATION:
Screening mammogram in one year.(Code:SP-[id number])

The importance of monthly self breast examination and an annual
clinical breast examination was discussed with the patient.

I have discussed the findings and recommendations with the patient.
Results were also provided in writing at the conclusion of the
visit. If applicable, a reminder letter will be sent to the patient
regarding the next appointment.

BI-RADS CATEGORY  1: Negative.

## 2016-12-10 ENCOUNTER — Ambulatory Visit (HOSPITAL_BASED_OUTPATIENT_CLINIC_OR_DEPARTMENT_OTHER): Payer: BLUE CROSS/BLUE SHIELD | Admitting: Hematology & Oncology

## 2016-12-10 ENCOUNTER — Other Ambulatory Visit (HOSPITAL_BASED_OUTPATIENT_CLINIC_OR_DEPARTMENT_OTHER): Payer: BLUE CROSS/BLUE SHIELD

## 2016-12-10 VITALS — BP 134/59 | HR 53 | Temp 97.8°F | Wt 173.4 lb

## 2016-12-10 DIAGNOSIS — C9210 Chronic myeloid leukemia, BCR/ABL-positive, not having achieved remission: Secondary | ICD-10-CM

## 2016-12-10 DIAGNOSIS — C9211 Chronic myeloid leukemia, BCR/ABL-positive, in remission: Secondary | ICD-10-CM

## 2016-12-10 LAB — CMP (CANCER CENTER ONLY)
ALT(SGPT): 15 U/L (ref 10–47)
AST: 24 U/L (ref 11–38)
Albumin: 4 g/dL (ref 3.3–5.5)
Alkaline Phosphatase: 57 U/L (ref 26–84)
BILIRUBIN TOTAL: 0.7 mg/dL (ref 0.20–1.60)
BUN: 11 mg/dL (ref 7–22)
CO2: 29 meq/L (ref 18–33)
CREATININE: 1 mg/dL (ref 0.6–1.2)
Calcium: 9.9 mg/dL (ref 8.0–10.3)
Chloride: 105 mEq/L (ref 98–108)
Glucose, Bld: 81 mg/dL (ref 73–118)
Potassium: 4.9 mEq/L — ABNORMAL HIGH (ref 3.3–4.7)
SODIUM: 148 meq/L — AB (ref 128–145)
TOTAL PROTEIN: 7.1 g/dL (ref 6.4–8.1)

## 2016-12-10 LAB — CBC WITH DIFFERENTIAL (CANCER CENTER ONLY)
BASO#: 0 10*3/uL (ref 0.0–0.2)
BASO%: 0.5 % (ref 0.0–2.0)
EOS ABS: 0.4 10*3/uL (ref 0.0–0.5)
EOS%: 6.7 % (ref 0.0–7.0)
HCT: 41.1 % (ref 34.8–46.6)
HGB: 13.6 g/dL (ref 11.6–15.9)
LYMPH#: 2 10*3/uL (ref 0.9–3.3)
LYMPH%: 35.6 % (ref 14.0–48.0)
MCH: 31.3 pg (ref 26.0–34.0)
MCHC: 33.1 g/dL (ref 32.0–36.0)
MCV: 95 fL (ref 81–101)
MONO#: 0.4 10*3/uL (ref 0.1–0.9)
MONO%: 7.1 % (ref 0.0–13.0)
NEUT%: 50.1 % (ref 39.6–80.0)
NEUTROS ABS: 2.8 10*3/uL (ref 1.5–6.5)
PLATELETS: 199 10*3/uL (ref 145–400)
RBC: 4.34 10*6/uL (ref 3.70–5.32)
RDW: 12.8 % (ref 11.1–15.7)
WBC: 5.5 10*3/uL (ref 3.9–10.0)

## 2016-12-10 NOTE — Progress Notes (Signed)
Hematology and Oncology Follow Up Visit  MARIELLA MEHLHORN [id number] 03-12-1966 51 y.o. 12/10/2016   Principle Diagnosis:   Chronic phase CML-major molecular remission  Current Therapy:    Observation     Interim History:  Ms.  Bole is back for followup. It has been over a year now that she's been off Sprycel. She has done quite well. She had a really nice holiday season. She and her family went down to [place]. The had a wonderful time.  She obviously had knee surgery in December. She had both knees done arthroscopically. She feels better. She was taken some physical therapy.  Her last BCR/ABL ratio was not detectable. I'm happy about this. If she does have some fatigue on occasion. She has had no rashes. She's had no change in bowel or bladder habits. She's had no leg swelling. She's had no cough.   Overall, her performance status is ECOG 0     Medications:  Current Outpatient Prescriptions:  .  ALPRAZolam (XANAX) 0.25 MG tablet, Take 0.125 mg by mouth at bedtime as needed for sleep., Disp: , Rfl:  .  ibuprofen (ADVIL,MOTRIN) 200 MG tablet, Take 200 mg by mouth as needed for headache or moderate pain. , Disp: , Rfl:  .  meloxicam (MOBIC) 7.5 MG tablet, Take 7.5 mg by mouth as needed., Disp: , Rfl:  .  MINIVELLE 0.1 MG/24HR patch, APPLY 1 PATCH TWICE WEEKLY AS DIRECTED., Disp: , Rfl: 12 .  Multiple Vitamin (MULTIVITAMIN WITH MINERALS) TABS, Take 1 tablet by mouth daily., Disp: , Rfl:  .  traMADol (ULTRAM) 50 MG tablet, Take 50 mg by mouth as needed., Disp: , Rfl:  .  Triamcinolone Acetonide (TRIAMCINOLONE 0.1 % CREAM : EUCERIN) CREA, Apply 1 application topically daily as needed. For eczema, Disp: 1 each, Rfl: 6 .  triamterene-hydrochlorothiazide (MAXZIDE-25) 37.5-25 MG tablet, Take 1 tablet by mouth 2 (two) times daily as needed (fluid retention)., Disp: , Rfl:  .  valACYclovir (VALTREX) 500 MG tablet, Take 1 tablet (500 mg total) by mouth as needed (Herpes Virus).,  Disp: 90 tablet, Rfl: 3 .  Witch Hazel (PREPARATION H FOR WOMEN) 20 % PADS, Apply topically as needed., Disp: , Rfl:   Allergies:  Allergies  Allergen Reactions  . Ciprofloxacin     rash  . Levaquin [Levofloxacin]     rash    Past Medical History, Surgical history, Social history, and Family History were reviewed and updated.  Review of Systems: As above  Physical Exam:  weight is 173 lb 6.4 oz (78.7 kg). Her oral temperature is 97.8 F (36.6 C). Her blood pressure is 134/59 (abnormal) and her pulse is 53 (abnormal).   Well-developed and well-nourished white female. Head and neck exam shows no ocular or oral lesions. I looked into her ears. Both tympanic membranes were clean with a good light reflex. She has no adenopathy. There is some slight pharyngeal erythema. Lungs are clear. Cardiac exam regular rate and rhythm with no murmurs rubs or bruits. Abdomen is soft. There are no fluid wave. There is no palpable liver or spleen tip. She has well-healed laparoscopic wounds. Back exam shows no tenderness over the spine, ribs or hips. Extremities shows no clubbing, cyanosis or edema. Neurological exam is nonfocal. Skin exam shows focal areas of macular lesions. There is no blister associated with this. There is no pustules. They're slightly raised.  Lab Results  Component Value Date   WBC 5.5 12/10/2016   HGB 13.6 12/10/2016  HCT 41.1 12/10/2016   MCV 95 12/10/2016   PLT 199 12/10/2016     Chemistry      Component Value Date/Time   NA 148 (H) 12/10/2016 0910   NA 140 10/11/2015 0856   K 4.9 (H) 12/10/2016 0910   K 4.5 10/11/2015 0856   CL 105 12/10/2016 0910   CO2 29 12/10/2016 0910   CO2 25 10/11/2015 0856   BUN 11 12/10/2016 0910   BUN 15.5 10/11/2015 0856   CREATININE 1.0 12/10/2016 0910   CREATININE 1.0 10/11/2015 0856      Component Value Date/Time   CALCIUM 9.9 12/10/2016 0910   CALCIUM 9.6 10/11/2015 0856   ALKPHOS 57 12/10/2016 0910   ALKPHOS 74 10/11/2015 0856     AST 24 12/10/2016 0910   AST 19 10/11/2015 0856   ALT 15 12/10/2016 0910   ALT 15 10/11/2015 0856   BILITOT 0.70 12/10/2016 0910   BILITOT 0.38 10/11/2015 0856         Impression and Plan: Ms. Warnell is a 52 year old female with chronic phase CML. She was diagnosed back in May of 2014.. She's been a major molecular remission.  We stopped her Sprycel in August 2016. She is doing well off Sprycel.   We will see what her BCR/ ABL ratio looks like. I looked at her blood on the microscope and everything looks okay.  She has good maturation of her white cells. I do not see any nucleated red cells. I do not see any abnormal platelets.  We will plan to get her back in 6 months.  [name], MD 2/5/201810:11 AM

## 2016-12-20 ENCOUNTER — Encounter: Payer: Self-pay | Admitting: Hematology & Oncology

## 2017-02-25 ENCOUNTER — Other Ambulatory Visit: Payer: Self-pay | Admitting: Ophthalmology

## 2017-02-25 DIAGNOSIS — H04001 Unspecified dacryoadenitis, right lacrimal gland: Secondary | ICD-10-CM

## 2017-03-10 ENCOUNTER — Ambulatory Visit
Admission: RE | Admit: 2017-03-10 | Discharge: 2017-03-10 | Disposition: A | Discharge status: Home / Self Care | Payer: BLUE CROSS/BLUE SHIELD | Source: Ambulatory Visit | Attending: Ophthalmology | Admitting: Ophthalmology

## 2017-03-10 DIAGNOSIS — H04001 Unspecified dacryoadenitis, right lacrimal gland: Secondary | ICD-10-CM

## 2017-03-10 MED ORDER — GADOBENATE DIMEGLUMINE 529 MG/ML IV SOLN
15.0000 mL | Freq: Once | INTRAVENOUS | Status: AC | PRN
  Administered 2017-03-10: 15 mL via INTRAVENOUS

## 2017-06-10 ENCOUNTER — Ambulatory Visit (HOSPITAL_BASED_OUTPATIENT_CLINIC_OR_DEPARTMENT_OTHER): Payer: BLUE CROSS/BLUE SHIELD | Admitting: Hematology & Oncology

## 2017-06-10 ENCOUNTER — Other Ambulatory Visit (HOSPITAL_BASED_OUTPATIENT_CLINIC_OR_DEPARTMENT_OTHER): Payer: BLUE CROSS/BLUE SHIELD

## 2017-06-10 DIAGNOSIS — C9210 Chronic myeloid leukemia, BCR/ABL-positive, not having achieved remission: Secondary | ICD-10-CM

## 2017-06-10 DIAGNOSIS — C9211 Chronic myeloid leukemia, BCR/ABL-positive, in remission: Secondary | ICD-10-CM | POA: Diagnosis not present

## 2017-06-10 DIAGNOSIS — B009 Herpesviral infection, unspecified: Secondary | ICD-10-CM

## 2017-06-10 LAB — CMP (CANCER CENTER ONLY)
ALK PHOS: 48 U/L (ref 26–84)
ALT(SGPT): 14 U/L (ref 10–47)
AST: 21 U/L (ref 11–38)
Albumin: 3.6 g/dL (ref 3.3–5.5)
BUN: 16 mg/dL (ref 7–22)
CALCIUM: 9.3 mg/dL (ref 8.0–10.3)
CHLORIDE: 106 meq/L (ref 98–108)
CO2: 28 meq/L (ref 18–33)
Creat: 1.2 mg/dl (ref 0.6–1.2)
GLUCOSE: 90 mg/dL (ref 73–118)
POTASSIUM: 3.9 meq/L (ref 3.3–4.7)
Sodium: 139 mEq/L (ref 128–145)
Total Bilirubin: 0.6 mg/dl (ref 0.20–1.60)
Total Protein: 6.9 g/dL (ref 6.4–8.1)

## 2017-06-10 LAB — CBC WITH DIFFERENTIAL (CANCER CENTER ONLY)
BASO#: 0 10*3/uL (ref 0.0–0.2)
BASO%: 0.6 % (ref 0.0–2.0)
EOS%: 5.8 % (ref 0.0–7.0)
Eosinophils Absolute: 0.3 10*3/uL (ref 0.0–0.5)
HEMATOCRIT: 38.9 % (ref 34.8–46.6)
HGB: 13 g/dL (ref 11.6–15.9)
LYMPH#: 1.9 10*3/uL (ref 0.9–3.3)
LYMPH%: 39.8 % (ref 14.0–48.0)
MCH: 31.4 pg (ref 26.0–34.0)
MCHC: 33.4 g/dL (ref 32.0–36.0)
MCV: 94 fL (ref 81–101)
MONO#: 0.4 10*3/uL (ref 0.1–0.9)
MONO%: 7.7 % (ref 0.0–13.0)
NEUT#: 2.2 10*3/uL (ref 1.5–6.5)
NEUT%: 46.1 % (ref 39.6–80.0)
PLATELETS: 172 10*3/uL (ref 145–400)
RBC: 4.14 10*6/uL (ref 3.70–5.32)
RDW: 12.7 % (ref 11.1–15.7)
WBC: 4.8 10*3/uL (ref 3.9–10.0)

## 2017-06-10 MED ORDER — VALACYCLOVIR HCL 500 MG PO TABS: 500.0000 mg | ORAL_TABLET | ORAL | 3 refills | Status: DC | PRN

## 2017-06-10 NOTE — Progress Notes (Signed)
Hematology and Oncology Follow Up Visit  Christina Kelly [id number] [birth year] 51 y.o. 06/10/2017   Principle Diagnosis:   Chronic phase CML-major molecular remission  Current Therapy:    Observation     Interim History:  [name] is back for followup. It has been over a year now that she's been off Sprycel. She has done quite well.   She comes in with her son. He is very well mannered. He is really fun to talk to. He had his cartoon that he was watching on her cell phone.  They have had a great summer. They have been to a couple family get-togethers.  She has had no problems with her knees. She had the surgery back in December.  Her last BCR/ABL was not detected. This was in February. As such, she is still in a MMR.  She has had no rashes. She's had no change in bowel or bladder habits. She's had no leg swelling. She's had no cough. She's been doing some kick boxing. She's lost a little weight which she is happy about.  Overall, her performance status is ECOG 0     Medications:  Current Outpatient Prescriptions:  .  ALPRAZolam (XANAX) 0.25 MG tablet, Take 0.125 mg by mouth at bedtime as needed for sleep., Disp: , Rfl:  .  BEPREVE 1.5 % SOLN, , Disp: , Rfl:  .  ibuprofen (ADVIL,MOTRIN) 200 MG tablet, Take 200 mg by mouth as needed for headache or moderate pain. , Disp: , Rfl:  .  meloxicam (MOBIC) 7.5 MG tablet, Take 7.5 mg by mouth as needed., Disp: , Rfl:  .  MINIVELLE 0.1 MG/24HR patch, APPLY 1 PATCH TWICE WEEKLY AS DIRECTED., Disp: , Rfl: 12 .  Multiple Vitamin (MULTIVITAMIN WITH MINERALS) TABS, Take 1 tablet by mouth daily., Disp: , Rfl:  .  RESTASIS MULTIDOSE 0.05 % ophthalmic emulsion, , Disp: , Rfl:  .  traMADol (ULTRAM) 50 MG tablet, Take 50 mg by mouth as needed., Disp: , Rfl:  .  Triamcinolone Acetonide (TRIAMCINOLONE 0.1 % CREAM : EUCERIN) CREA, Apply 1 application topically daily as needed. For eczema, Disp: 1 each, Rfl: 6 .   triamterene-hydrochlorothiazide (MAXZIDE-25) 37.5-25 MG tablet, Take 1 tablet by mouth 2 (two) times daily as needed (fluid retention)., Disp: , Rfl:  .  valACYclovir (VALTREX) 500 MG tablet, Take 1 tablet (500 mg total) by mouth as needed (Herpes Virus)., Disp: 90 tablet, Rfl: 3 .  Witch Hazel (PREPARATION H FOR WOMEN) 20 % PADS, Apply topically as needed., Disp: , Rfl:   Allergies:  Allergies  Allergen Reactions  . Ciprofloxacin     rash  . Levaquin [Levofloxacin]     rash    Past Medical History, Surgical history, Social history, and Family History were reviewed and updated.  Review of Systems: As above  Physical Exam:  vitals were not taken for this visit.  Well-developed and well-nourished white female. Head and neck exam shows no ocular or oral lesions. I looked into her ears. Both tympanic membranes were clean with a good light reflex. She has no adenopathy. There is some slight pharyngeal erythema. Lungs are clear. Cardiac exam regular rate and rhythm with no murmurs rubs or bruits. Abdomen is soft. There are no fluid wave. There is no palpable liver or spleen tip. She has well-healed laparoscopic wounds. Back exam shows no tenderness over the spine, ribs or hips. Extremities shows no clubbing, cyanosis or edema. Neurological exam is nonfocal. Skin exam shows focal areas  of macular lesions. There is no blister associated with this. There is no pustules. They're slightly raised.  Lab Results  Component Value Date   WBC 4.8 06/10/2017   HGB 13.0 06/10/2017   HCT 38.9 06/10/2017   MCV 94 06/10/2017   PLT 172 06/10/2017     Chemistry      Component Value Date/Time   NA 148 (H) 12/10/2016 0910   NA 140 10/11/2015 0856   K 4.9 (H) 12/10/2016 0910   K 4.5 10/11/2015 0856   CL 105 12/10/2016 0910   CO2 29 12/10/2016 0910   CO2 25 10/11/2015 0856   BUN 11 12/10/2016 0910   BUN 15.5 10/11/2015 0856   CREATININE 1.0 12/10/2016 0910   CREATININE 1.0 10/11/2015 0856        Component Value Date/Time   CALCIUM 9.9 12/10/2016 0910   CALCIUM 9.6 10/11/2015 0856   ALKPHOS 57 12/10/2016 0910   ALKPHOS 74 10/11/2015 0856   AST 24 12/10/2016 0910   AST 19 10/11/2015 0856   ALT 15 12/10/2016 0910   ALT 15 10/11/2015 0856   BILITOT 0.70 12/10/2016 0910   BILITOT 0.38 10/11/2015 0856         Impression and Plan: Christina Kelly is a 51 year old female with chronic phase CML. She was diagnosed back in May of 2014.. She's been a major molecular remission.  We stopped her Sprycel in August 2016. She is doing well off Sprycel.   We will see what her BCR/ ABL ratio looks like. I looked at her blood on the microscope and everything looks okay.  She has good maturation of her white cells. I do not see any nucleated red cells. I do not see any abnormal platelets.  We will plan to get her back in 6 months.  [name], MD 8/6/20189:42 AM

## 2017-06-14 ENCOUNTER — Telehealth: Payer: Self-pay | Admitting: *Deleted

## 2017-06-14 NOTE — Telephone Encounter (Addendum)
Patient aware of results  ----- Message from [name], MD sent at 06/13/2017  2:04 PM EDT ----- Call - NO evidence of CML by your molecular studies!!!  Laurey Arrow

## 2017-06-19 ENCOUNTER — Encounter: Payer: Self-pay | Admitting: *Deleted

## 2017-12-11 ENCOUNTER — Inpatient Hospital Stay (HOSPITAL_BASED_OUTPATIENT_CLINIC_OR_DEPARTMENT_OTHER): Payer: 59 | Admitting: Hematology & Oncology

## 2017-12-11 ENCOUNTER — Inpatient Hospital Stay: Payer: 59 | Attending: Hematology & Oncology

## 2017-12-11 ENCOUNTER — Encounter: Payer: Self-pay | Admitting: Hematology & Oncology

## 2017-12-11 ENCOUNTER — Other Ambulatory Visit: Payer: Self-pay

## 2017-12-11 VITALS — BP 103/68 | HR 61 | Temp 98.7°F | Resp 16 | Wt 178.0 lb

## 2017-12-11 DIAGNOSIS — C9210 Chronic myeloid leukemia, BCR/ABL-positive, not having achieved remission: Secondary | ICD-10-CM

## 2017-12-11 DIAGNOSIS — C9211 Chronic myeloid leukemia, BCR/ABL-positive, in remission: Secondary | ICD-10-CM | POA: Insufficient documentation

## 2017-12-11 DIAGNOSIS — Z79899 Other long term (current) drug therapy: Secondary | ICD-10-CM

## 2017-12-11 DIAGNOSIS — B009 Herpesviral infection, unspecified: Secondary | ICD-10-CM

## 2017-12-11 LAB — CMP (CANCER CENTER ONLY)
ALT: 11 U/L (ref 0–55)
AST: 18 U/L (ref 5–34)
Albumin: 4 g/dL (ref 3.5–5.0)
Alkaline Phosphatase: 46 U/L (ref 40–150)
Anion gap: 9 (ref 3–11)
BUN: 17 mg/dL (ref 7–26)
CHLORIDE: 108 mmol/L (ref 98–109)
CO2: 27 mmol/L (ref 22–29)
CREATININE: 1.04 mg/dL (ref 0.60–1.10)
Calcium: 9.6 mg/dL (ref 8.4–10.4)
GFR, Estimated: >60 mL/min (ref >60)
Glucose, Bld: 63 mg/dL — ABNORMAL LOW (ref 70–140)
POTASSIUM: 5.7 mmol/L — AB (ref 3.5–5.1)
SODIUM: 144 mmol/L (ref 136–145)
Total Bilirubin: 0.4 mg/dL (ref 0.2–1.2)
Total Protein: 7 g/dL (ref 6.4–8.3)

## 2017-12-11 LAB — CBC WITH DIFFERENTIAL (CANCER CENTER ONLY)
BASOS ABS: 0 10*3/uL (ref 0.0–0.1)
Basophils Relative: 1 %
EOS ABS: 0.2 10*3/uL (ref 0.0–0.5)
EOS PCT: 5 %
HCT: 39.2 % (ref 34.8–46.6)
Hemoglobin: 12.8 g/dL (ref 11.6–15.9)
Lymphocytes Relative: 36 %
Lymphs Abs: 1.6 10*3/uL (ref 0.9–3.3)
MCH: 31 pg (ref 26.0–34.0)
MCHC: 32.7 g/dL (ref 32.0–36.0)
MCV: 94.9 fL (ref 81.0–101.0)
Monocytes Absolute: 0.3 10*3/uL (ref 0.1–0.9)
Monocytes Relative: 7 %
Neutro Abs: 2.4 10*3/uL (ref 1.5–6.5)
Neutrophils Relative %: 51 %
PLATELETS: 175 10*3/uL (ref 145–400)
RBC: 4.13 MIL/uL (ref 3.70–5.32)
RDW: 13 % (ref 11.1–15.7)
WBC: 4.6 10*3/uL (ref 3.9–10.0)

## 2017-12-11 LAB — LACTATE DEHYDROGENASE: LDH: 162 U/L (ref 125–245)
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
## 2017-12-12 DIAGNOSIS — Z0000 Encounter for general adult medical examination without abnormal findings: Secondary | ICD-10-CM | POA: Diagnosis not present

## 2017-12-12 DIAGNOSIS — Z23 Encounter for immunization: Secondary | ICD-10-CM | POA: Diagnosis not present

## 2017-12-12 DIAGNOSIS — Z13220 Encounter for screening for lipoid disorders: Secondary | ICD-10-CM | POA: Diagnosis not present

## 2017-12-19 ENCOUNTER — Encounter: Payer: Self-pay | Admitting: Hematology & Oncology

## 2018-01-17 LAB — BCR/ABL

## 2018-01-24 DIAGNOSIS — R1310 Dysphagia, unspecified: Secondary | ICD-10-CM | POA: Diagnosis not present

## 2018-03-17 DIAGNOSIS — W57XXXA Bitten or stung by nonvenomous insect and other nonvenomous arthropods, initial encounter: Secondary | ICD-10-CM | POA: Diagnosis not present

## 2018-03-17 DIAGNOSIS — K219 Gastro-esophageal reflux disease without esophagitis: Secondary | ICD-10-CM | POA: Diagnosis not present

## 2018-03-17 DIAGNOSIS — S30861A Insect bite (nonvenomous) of abdominal wall, initial encounter: Secondary | ICD-10-CM | POA: Diagnosis not present

## 2018-03-24 DIAGNOSIS — R0989 Other specified symptoms and signs involving the circulatory and respiratory systems: Secondary | ICD-10-CM | POA: Diagnosis not present

## 2018-03-24 DIAGNOSIS — Z1211 Encounter for screening for malignant neoplasm of colon: Secondary | ICD-10-CM | POA: Diagnosis not present

## 2018-03-24 DIAGNOSIS — K219 Gastro-esophageal reflux disease without esophagitis: Secondary | ICD-10-CM | POA: Diagnosis not present

## 2018-04-01 DIAGNOSIS — S30861D Insect bite (nonvenomous) of abdominal wall, subsequent encounter: Secondary | ICD-10-CM | POA: Diagnosis not present

## 2018-04-01 DIAGNOSIS — J020 Streptococcal pharyngitis: Secondary | ICD-10-CM | POA: Diagnosis not present

## 2018-04-01 DIAGNOSIS — J029 Acute pharyngitis, unspecified: Secondary | ICD-10-CM | POA: Diagnosis not present

## 2018-06-11 ENCOUNTER — Inpatient Hospital Stay: Payer: 59 | Attending: Hematology & Oncology | Admitting: Hematology & Oncology

## 2018-06-11 ENCOUNTER — Inpatient Hospital Stay: Payer: 59

## 2018-06-11 ENCOUNTER — Other Ambulatory Visit: Payer: Self-pay

## 2018-06-11 VITALS — BP 104/70 | HR 55 | Temp 98.3°F | Resp 17 | Wt 174.8 lb

## 2018-06-11 DIAGNOSIS — C9210 Chronic myeloid leukemia, BCR/ABL-positive, not having achieved remission: Secondary | ICD-10-CM

## 2018-06-11 DIAGNOSIS — C9211 Chronic myeloid leukemia, BCR/ABL-positive, in remission: Secondary | ICD-10-CM | POA: Diagnosis present

## 2018-06-11 LAB — CBC WITH DIFFERENTIAL (CANCER CENTER ONLY)
BASOS ABS: 0.1 10*3/uL (ref 0.0–0.1)
BASOS PCT: 1 %
EOS PCT: 5 %
Eosinophils Absolute: 0.3 10*3/uL (ref 0.0–0.5)
HEMATOCRIT: 40.9 % (ref 34.8–46.6)
Hemoglobin: 13.1 g/dL (ref 11.6–15.9)
Lymphocytes Relative: 31 %
Lymphs Abs: 1.7 10*3/uL (ref 0.9–3.3)
MCH: 31 pg (ref 26.0–34.0)
MCHC: 32 g/dL (ref 32.0–36.0)
MCV: 96.7 fL (ref 81.0–101.0)
MONO ABS: 0.4 10*3/uL (ref 0.1–0.9)
MONOS PCT: 7 %
NEUTROS ABS: 3 10*3/uL (ref 1.5–6.5)
Neutrophils Relative %: 56 %
PLATELETS: 201 10*3/uL (ref 145–400)
RBC: 4.23 MIL/uL (ref 3.70–5.32)
RDW: 13.5 % (ref 11.1–15.7)
WBC Count: 5.5 10*3/uL (ref 3.9–10.0)

## 2018-06-11 LAB — CMP (CANCER CENTER ONLY)
ALBUMIN: 3.8 g/dL (ref 3.5–5.0)
ALT: 18 U/L (ref 10–47)
ANION GAP: 6 (ref 5–15)
AST: 27 U/L (ref 11–38)
Alkaline Phosphatase: 45 U/L (ref 26–84)
BUN: 16 mg/dL (ref 7–22)
CALCIUM: 10.3 mg/dL (ref 8.0–10.3)
CO2: 31 mmol/L (ref 18–33)
CREATININE: 1.1 mg/dL (ref 0.60–1.20)
Chloride: 109 mmol/L — ABNORMAL HIGH (ref 98–108)
GLUCOSE: 84 mg/dL (ref 73–118)
Potassium: 4.5 mmol/L (ref 3.3–4.7)
Sodium: 146 mmol/L — ABNORMAL HIGH (ref 128–145)
TOTAL PROTEIN: 7.5 g/dL (ref 6.4–8.1)
Total Bilirubin: 0.6 mg/dL (ref 0.2–1.6)

## 2018-06-11 LAB — LACTATE DEHYDROGENASE: LDH: 148 U/L (ref 98–192)

## 2018-06-11 LAB — TECHNOLOGIST SMEAR REVIEW: Tech Review: NORMAL

## 2018-06-11 NOTE — Progress Notes (Signed)
Hematology and Oncology Follow Up Visit  SAPHIRA LAHMANN [id number] 01/18/1966 52 y.o. 06/11/2018   Principle Diagnosis:   Chronic phase CML-major molecular remission  Current Therapy:    Observation.  She has been off of Sprycel since August 2016     Interim History:  Ms.  Pall is back for followup.  She is doing well.  Ms. Letendre has been off Sprycel now for 3 years.  Her last BCR/ABL ratio was not detected back in February.  She is doing so well.  Her son is in good year-round school.  He is enjoying this.  She had a tick bite earlier this summer.  This is on her right flank.  She did not have any antibiotics for this.  She has had no change in bowel or bladder habits.  She has had no fever.  She is had no cough or shortness of breath.  She has had no rashes.  She has had no leg swelling.  Unfortunately, looks like it is can be a lot easier for her to see a local oncologist.  We will see about trying to get her seen locally as this will be somewhat easier for her.  We will answer a lot as we have seen her probably for about 7 or 8 years.    Currently, her performance status is ECOG 0   Medications:  Current Outpatient Medications:  .  ALPRAZolam (XANAX) 0.25 MG tablet, Take 0.125 mg by mouth at bedtime as needed for sleep., Disp: , Rfl:  .  estradiol (ESTRACE) 2 MG tablet, Take 2 mg by mouth daily., Disp: , Rfl:  .  ibuprofen (ADVIL,MOTRIN) 200 MG tablet, Take 200 mg by mouth as needed for headache or moderate pain. , Disp: , Rfl:  .  Melatonin 3 MG TABS, Take 3 mg by mouth as needed., Disp: , Rfl:  .  Multiple Vitamin (MULTIVITAMIN WITH MINERALS) TABS, Take 1 tablet by mouth daily., Disp: , Rfl:  .  pantoprazole (PROTONIX) 40 MG tablet, Take 40 mg by mouth daily., Disp: , Rfl: 5 .  Triamcinolone Acetonide (TRIAMCINOLONE 0.1 % CREAM : EUCERIN) CREA, Apply 1 application topically daily as needed. For eczema, Disp: 1 each, Rfl: 6 .  triamterene-hydrochlorothiazide  (MAXZIDE-25) 37.5-25 MG tablet, Take 1 tablet by mouth 2 (two) times daily as needed (fluid retention)., Disp: , Rfl:  .  valACYclovir (VALTREX) 500 MG tablet, Take 1 tablet (500 mg total) by mouth as needed (Herpes Virus). (Patient taking differently: Take 500 mg by mouth as needed (Herpes Virus). Take 4 tablets then another 4 tablets in 12 hours.), Disp: 90 tablet, Rfl: 3  Allergies:  Allergies  Allergen Reactions  . Ciprofloxacin     rash  . Levaquin [Levofloxacin]     rash    Past Medical History, Surgical history, Social history, and Family History were reviewed and updated.  Review of Systems: Review of Systems  Constitutional: Negative.   HENT: Negative.   Eyes: Negative.   Respiratory: Negative.   Cardiovascular: Negative.   Gastrointestinal: Negative.   Genitourinary: Negative.   Musculoskeletal: Negative.   Skin: Negative.   Neurological: Negative.   Endo/Heme/Allergies: Negative.   Psychiatric/Behavioral: Negative.      Physical Exam:  weight is 174 lb 12.8 oz (79.3 kg). Her oral temperature is 98.3 F (36.8 C). Her blood pressure is 104/70 and her pulse is 55 (abnormal). Her respiration is 17 and oxygen saturation is 100%.   Physical Exam  Constitutional: She is oriented  to person, place, and time.  HENT:  Head: Normocephalic and atraumatic.  Mouth/Throat: Oropharynx is clear and moist.  Eyes: Pupils are equal, round, and reactive to light. EOM are normal.  Neck: Normal range of motion.  Cardiovascular: Normal rate, regular rhythm and normal heart sounds.  Pulmonary/Chest: Effort normal and breath sounds normal.  Abdominal: Soft. Bowel sounds are normal.  Musculoskeletal: Normal range of motion. She exhibits no edema, tenderness or deformity.  Lymphadenopathy:    She has no cervical adenopathy.  Neurological: She is alert and oriented to person, place, and time.  Skin: Skin is warm and dry. No rash noted. No erythema.  Psychiatric: She has a normal mood  and affect. Her behavior is normal. Judgment and thought content normal.  Vitals reviewed.   Lab Results  Component Value Date   WBC 5.5 06/11/2018   HGB 13.1 06/11/2018   HCT 40.9 06/11/2018   MCV 96.7 06/11/2018   PLT 201 06/11/2018     Chemistry      Component Value Date/Time   NA 144 12/11/2017 0831   NA 139 06/10/2017 0917   NA 140 10/11/2015 0856   K 5.7 (H) 12/11/2017 0831   K 3.9 06/10/2017 0917   K 4.5 10/11/2015 0856   CL 108 12/11/2017 0831   CL 106 06/10/2017 0917   CO2 27 12/11/2017 0831   CO2 28 06/10/2017 0917   CO2 25 10/11/2015 0856   BUN 17 12/11/2017 0831   BUN 16 06/10/2017 0917   BUN 15.5 10/11/2015 0856   CREATININE 1.04 12/11/2017 0831   CREATININE 1.2 06/10/2017 0917   CREATININE 1.0 10/11/2015 0856      Component Value Date/Time   CALCIUM 9.6 12/11/2017 0831   CALCIUM 9.3 06/10/2017 0917   CALCIUM 9.6 10/11/2015 0856   ALKPHOS 46 12/11/2017 0831   ALKPHOS 48 06/10/2017 0917   ALKPHOS 74 10/11/2015 0856   AST 18 12/11/2017 0831   AST 19 10/11/2015 0856   ALT 11 12/11/2017 0831   ALT 14 06/10/2017 0917   ALT 15 10/11/2015 0856   BILITOT 0.4 12/11/2017 0831   BILITOT 0.38 10/11/2015 0856       Impression and Plan: Ms. Galeana is a 52 year old female with chronic phase CML. She was diagnosed back in May of 2014.. She's been a major molecular remission.  We stopped her Sprycel in August 2016. She is doing well off Sprycel.   I would be surprised if she has relapse of the CML.  Her blood counts look great.  I looked at her blood on the microscope and could not find anything unusual.  We will see about finding her a oncologist in [place] that will be a lot easier for her to see.  Thankfully, she only has to be seen twice a year for right now.  It has been a pleasure to see her and help her out.  She is an inspiration to [place].  [name], MD 8/7/201911:11 AM

## 2018-06-23 DIAGNOSIS — R12 Heartburn: Secondary | ICD-10-CM | POA: Diagnosis not present

## 2018-06-23 DIAGNOSIS — R1012 Left upper quadrant pain: Secondary | ICD-10-CM | POA: Diagnosis not present

## 2018-07-03 DIAGNOSIS — K210 Gastro-esophageal reflux disease with esophagitis: Secondary | ICD-10-CM | POA: Diagnosis not present

## 2018-07-03 DIAGNOSIS — K209 Esophagitis, unspecified: Secondary | ICD-10-CM | POA: Diagnosis not present

## 2018-07-03 DIAGNOSIS — K2970 Gastritis, unspecified, without bleeding: Secondary | ICD-10-CM | POA: Diagnosis not present

## 2018-07-03 DIAGNOSIS — K621 Rectal polyp: Secondary | ICD-10-CM | POA: Diagnosis not present

## 2018-07-03 DIAGNOSIS — D125 Benign neoplasm of sigmoid colon: Secondary | ICD-10-CM | POA: Diagnosis not present

## 2018-07-03 DIAGNOSIS — Z1211 Encounter for screening for malignant neoplasm of colon: Secondary | ICD-10-CM | POA: Diagnosis not present

## 2018-07-03 DIAGNOSIS — Z800 Family history of malignant neoplasm of digestive organs: Secondary | ICD-10-CM | POA: Diagnosis not present

## 2018-07-03 DIAGNOSIS — K635 Polyp of colon: Secondary | ICD-10-CM | POA: Diagnosis not present

## 2018-07-03 DIAGNOSIS — K317 Polyp of stomach and duodenum: Secondary | ICD-10-CM | POA: Diagnosis not present

## 2018-07-04 ENCOUNTER — Encounter: Payer: Self-pay | Admitting: Hematology & Oncology

## 2018-07-08 LAB — BCR/ABL

## 2018-07-25 DIAGNOSIS — R1012 Left upper quadrant pain: Secondary | ICD-10-CM | POA: Diagnosis not present

## 2018-07-25 DIAGNOSIS — R12 Heartburn: Secondary | ICD-10-CM | POA: Diagnosis not present

## 2018-07-25 DIAGNOSIS — R070 Pain in throat: Secondary | ICD-10-CM | POA: Diagnosis not present

## 2018-08-11 DIAGNOSIS — Z23 Encounter for immunization: Secondary | ICD-10-CM | POA: Diagnosis not present

## 2018-09-05 DIAGNOSIS — N2889 Other specified disorders of kidney and ureter: Secondary | ICD-10-CM | POA: Diagnosis not present

## 2018-09-17 DIAGNOSIS — N2889 Other specified disorders of kidney and ureter: Secondary | ICD-10-CM | POA: Diagnosis not present

## 2018-09-24 DIAGNOSIS — S30861D Insect bite (nonvenomous) of abdominal wall, subsequent encounter: Secondary | ICD-10-CM | POA: Diagnosis not present

## 2018-09-24 DIAGNOSIS — B351 Tinea unguium: Secondary | ICD-10-CM | POA: Diagnosis not present

## 2018-09-25 DIAGNOSIS — N644 Mastodynia: Secondary | ICD-10-CM | POA: Diagnosis not present

## 2018-10-10 DIAGNOSIS — R070 Pain in throat: Secondary | ICD-10-CM | POA: Diagnosis not present

## 2018-10-10 DIAGNOSIS — R1012 Left upper quadrant pain: Secondary | ICD-10-CM | POA: Diagnosis not present

## 2018-10-10 DIAGNOSIS — K219 Gastro-esophageal reflux disease without esophagitis: Secondary | ICD-10-CM | POA: Diagnosis not present

## 2018-12-15 ENCOUNTER — Other Ambulatory Visit: Payer: Self-pay | Admitting: *Deleted

## 2018-12-15 ENCOUNTER — Telehealth: Payer: Self-pay | Admitting: *Deleted

## 2018-12-15 DIAGNOSIS — C9210 Chronic myeloid leukemia, BCR/ABL-positive, not having achieved remission: Secondary | ICD-10-CM

## 2018-12-15 NOTE — Telephone Encounter (Signed)
Message left from patient requesting a CK drawn d/t recent admission for rhabdomyolysis.  Dr. Marin Olp informed and ok for CK to be drawn.  Call placed back to patient and patient notified.

## 2018-12-16 ENCOUNTER — Inpatient Hospital Stay: Payer: 59 | Attending: Hematology & Oncology | Admitting: Hematology & Oncology

## 2018-12-16 ENCOUNTER — Encounter: Payer: Self-pay | Admitting: Hematology & Oncology

## 2018-12-16 ENCOUNTER — Inpatient Hospital Stay: Payer: 59

## 2018-12-16 ENCOUNTER — Other Ambulatory Visit: Payer: Self-pay

## 2018-12-16 VITALS — BP 115/77 | Temp 98.2°F | Resp 18 | Wt 177.0 lb

## 2018-12-16 DIAGNOSIS — C9210 Chronic myeloid leukemia, BCR/ABL-positive, not having achieved remission: Secondary | ICD-10-CM

## 2018-12-16 DIAGNOSIS — C9211 Chronic myeloid leukemia, BCR/ABL-positive, in remission: Secondary | ICD-10-CM | POA: Diagnosis present

## 2018-12-16 LAB — CMP (CANCER CENTER ONLY)
ALK PHOS: 45 U/L (ref 38–126)
ALT: 40 U/L (ref 0–44)
AST: 26 U/L (ref 15–41)
Albumin: 4.9 g/dL (ref 3.5–5.0)
Anion gap: 7 (ref 5–15)
BUN: 23 mg/dL — ABNORMAL HIGH (ref 6–20)
CALCIUM: 10.8 mg/dL — AB (ref 8.9–10.3)
CO2: 32 mmol/L (ref 22–32)
Chloride: 104 mmol/L (ref 98–111)
Creatinine: 1.2 mg/dL — ABNORMAL HIGH (ref 0.44–1.00)
GFR, Est AFR Am: >60 mL/min (ref >60)
GFR, Estimated: 53 mL/min — ABNORMAL LOW (ref >60)
Glucose, Bld: 88 mg/dL (ref 70–99)
Potassium: 4.5 mmol/L (ref 3.5–5.1)
SODIUM: 143 mmol/L (ref 135–145)
Total Bilirubin: 0.4 mg/dL (ref 0.3–1.2)
Total Protein: 7.3 g/dL (ref 6.5–8.1)

## 2018-12-16 LAB — CBC WITH DIFFERENTIAL (CANCER CENTER ONLY)
Abs Immature Granulocytes: 0.01 10*3/uL (ref 0.00–0.07)
Basophils Absolute: 0.1 10*3/uL (ref 0.0–0.1)
Basophils Relative: 1 %
EOS ABS: 0.2 10*3/uL (ref 0.0–0.5)
Eosinophils Relative: 3 %
HCT: 43 % (ref 36.0–46.0)
Hemoglobin: 13.7 g/dL (ref 12.0–15.0)
Immature Granulocytes: 0 %
LYMPHS ABS: 1.7 10*3/uL (ref 0.7–4.0)
Lymphocytes Relative: 31 %
MCH: 30.6 pg (ref 26.0–34.0)
MCHC: 31.9 g/dL (ref 30.0–36.0)
MCV: 96.2 fL (ref 80.0–100.0)
Monocytes Absolute: 0.4 10*3/uL (ref 0.1–1.0)
Monocytes Relative: 8 %
Neutro Abs: 3.1 10*3/uL (ref 1.7–7.7)
Neutrophils Relative %: 57 %
Platelet Count: 232 10*3/uL (ref 150–400)
RBC: 4.47 MIL/uL (ref 3.87–5.11)
RDW: 12.9 % (ref 11.5–15.5)
WBC Count: 5.5 10*3/uL (ref 4.0–10.5)
nRBC: 0 % (ref 0.0–0.2)

## 2018-12-16 LAB — CK TOTAL AND CKMB (NOT AT ARMC)
CK TOTAL: 177 U/L (ref 38–234)
CK, MB: 4.2 ng/mL (ref 0.5–5.0)
Relative Index: 2.4 (ref 0.0–2.5)

## 2018-12-16 NOTE — Progress Notes (Signed)
Hematology and Oncology Follow Up Visit  Christina Kelly [id number] 1966-01-12 53 y.o. 12/16/2018   Principle Diagnosis:   Chronic phase CML-major molecular remission  Current Therapy:    Observation.  She has been off of Sprycel since August 2016     Interim History:  [name] is back for followup.  Surprisingly enough, she was recently discharged from a hospital in [place] after developing rhabdomyolysis.  She developed this after exercising quite a bit.  I am absolutely surprised by this.  I really have never heard of rhabdomyolysis with exercise.  I have heard of it in triathlete and ultra marathon trainers.  We are checking a CK on her today.  Otherwise, she is doing well.  She does not have a detectable BCR/ABL gene product.  She is in a molecular remission now for 3 and half years, being off the Sprycel.  She has had no issues with fever.  She has had no change in bowel or bladder habits.  She has had no issues with rashes.  She has had no change in bowel or bladder habits..  She does not have a monthly cycle because she has had a hysterectomy.  Thankfully, her son is doing quite well in school.  He is not too happy about school but seems to be adjusting well.  Currently, her performance status is ECOG 0   Medications:  Current Outpatient Medications:  .  esomeprazole (NEXIUM) 40 MG capsule, Take 40 mg by mouth daily., Disp: , Rfl:  .  ALPRAZolam (XANAX) 0.25 MG tablet, Take 0.125 mg by mouth at bedtime as needed for sleep., Disp: , Rfl:  .  ciclopirox (PENLAC) 8 % solution, , Disp: , Rfl:  .  estradiol (ESTRACE) 2 MG tablet, Take 2 mg by mouth daily., Disp: , Rfl:  .  famotidine (PEPCID) 20 MG tablet, Take 20 mg by mouth daily., Disp: , Rfl:  .  ibuprofen (ADVIL,MOTRIN) 200 MG tablet, Take 200 mg by mouth as needed for headache or moderate pain. , Disp: , Rfl:  .  Melatonin 3 MG TABS, Take 3 mg by mouth as needed., Disp: , Rfl:  .  Multiple Vitamin (MULTIVITAMIN  WITH MINERALS) TABS, Take 1 tablet by mouth daily., Disp: , Rfl:  .  pantoprazole (PROTONIX) 40 MG tablet, Take 40 mg by mouth daily., Disp: , Rfl: 5 .  Triamcinolone Acetonide (TRIAMCINOLONE 0.1 % CREAM : EUCERIN) CREA, Apply 1 application topically daily as needed. For eczema, Disp: 1 each, Rfl: 6 .  triamterene-hydrochlorothiazide (MAXZIDE-25) 37.5-25 MG tablet, Take 1 tablet by mouth 2 (two) times daily as needed (fluid retention)., Disp: , Rfl:  .  valACYclovir (VALTREX) 500 MG tablet, Take 1 tablet (500 mg total) by mouth as needed (Herpes Virus). (Patient taking differently: Take 500 mg by mouth as needed (Herpes Virus). Take 4 tablets then another 4 tablets in 12 hours.), Disp: 90 tablet, Rfl: 3  Allergies:  Allergies  Allergen Reactions  . Ciprofloxacin Rash    rash  . Levofloxacin Rash    rash    Past Medical History, Surgical history, Social history, and Family History were reviewed and updated.  Review of Systems: Review of Systems  Constitutional: Negative.   HENT: Negative.   Eyes: Negative.   Respiratory: Negative.   Cardiovascular: Negative.   Gastrointestinal: Negative.   Genitourinary: Negative.   Musculoskeletal: Negative.   Skin: Negative.   Neurological: Negative.   Endo/Heme/Allergies: Negative.   Psychiatric/Behavioral: Negative.  Physical Exam:  vitals were not taken for this visit.   Physical Exam Vitals signs reviewed.  HENT:     Head: Normocephalic and atraumatic.  Eyes:     Pupils: Pupils are equal, round, and reactive to light.  Neck:     Musculoskeletal: Normal range of motion.  Cardiovascular:     Rate and Rhythm: Normal rate and regular rhythm.     Heart sounds: Normal heart sounds.  Pulmonary:     Effort: Pulmonary effort is normal.     Breath sounds: Normal breath sounds.  Abdominal:     General: Bowel sounds are normal.     Palpations: Abdomen is soft.  Musculoskeletal: Normal range of motion.        General: No tenderness  or deformity.  Lymphadenopathy:     Cervical: No cervical adenopathy.  Skin:    General: Skin is warm and dry.     Findings: No erythema or rash.  Neurological:     Mental Status: She is alert and oriented to person, place, and time.  Psychiatric:        Behavior: Behavior normal.        Thought Content: Thought content normal.        Judgment: Judgment normal.     Lab Results  Component Value Date   WBC 5.5 06/11/2018   HGB 13.1 06/11/2018   HCT 40.9 06/11/2018   MCV 96.7 06/11/2018   PLT 201 06/11/2018     Chemistry      Component Value Date/Time   NA 146 (H) 06/11/2018 1026   NA 139 06/10/2017 0917   NA 140 10/11/2015 0856   K 4.5 06/11/2018 1026   K 3.9 06/10/2017 0917   K 4.5 10/11/2015 0856   CL 109 (H) 06/11/2018 1026   CL 106 06/10/2017 0917   CO2 31 06/11/2018 1026   CO2 28 06/10/2017 0917   CO2 25 10/11/2015 0856   BUN 16 06/11/2018 1026   BUN 16 06/10/2017 0917   BUN 15.5 10/11/2015 0856   CREATININE 1.10 06/11/2018 1026   CREATININE 1.2 06/10/2017 0917   CREATININE 1.0 10/11/2015 0856      Component Value Date/Time   CALCIUM 10.3 06/11/2018 1026   CALCIUM 9.3 06/10/2017 0917   CALCIUM 9.6 10/11/2015 0856   ALKPHOS 45 06/11/2018 1026   ALKPHOS 48 06/10/2017 0917   ALKPHOS 74 10/11/2015 0856   AST 27 06/11/2018 1026   AST 19 10/11/2015 0856   ALT 18 06/11/2018 1026   ALT 14 06/10/2017 0917   ALT 15 10/11/2015 0856   BILITOT 0.6 06/11/2018 1026   BILITOT 0.38 10/11/2015 0856       Impression and Plan: Ms. Menton is a 53 year old female with chronic phase CML. She was diagnosed back in May of 2014.. She's been a major molecular remission.  We stopped her Sprycel in August 2016. She is doing well off Sprycel.   We will see with the BCR/ABL analysis is.  I looked at her blood under the microscope.  I do not see anything that looks like CML relapse.  I think we get her back down to 8 months.  I think this would be very  reasonable.  Hopefully, if she continues to stay in a major molecular remission, we will be able to get her back yearly.  Given that she drives quite a bit to see [place], I do not want to make life more difficult for her.  [name], MD 2/11/20209:17  AM

## 2018-12-17 ENCOUNTER — Telehealth: Payer: Self-pay | Admitting: *Deleted

## 2018-12-17 NOTE — Telephone Encounter (Signed)
As noted below by Dr. Marin Olp, I informed the patient that the CK is normal at 177. Patient requested labs be faxed to Dr. Harlan Stains, PCP. Labs faxed electronically.

## 2018-12-17 NOTE — Telephone Encounter (Signed)
-----   Message from [name], MD sent at 12/17/2018  6:56 AM EST ----- Call - the CK is now normal at 177.  pete

## 2018-12-26 ENCOUNTER — Telehealth: Payer: Self-pay | Admitting: *Deleted

## 2018-12-26 LAB — BCR/ABL

## 2018-12-26 NOTE — Telephone Encounter (Signed)
-----   Message from [name], MD sent at 12/26/2018 10:13 AM EST ----- Call - the [facility] still in remission!!!  Great job!!  Laurey Arrow

## 2018-12-26 NOTE — Telephone Encounter (Signed)
Patient notified per order of Dr. Marin Olp that [facility] is still in remission.  Patient appreciative of call and has no questions at this time.

## 2019-08-18 ENCOUNTER — Inpatient Hospital Stay: Payer: BC Managed Care – PPO

## 2019-08-18 ENCOUNTER — Telehealth: Payer: Self-pay | Admitting: Hematology & Oncology

## 2019-08-18 ENCOUNTER — Inpatient Hospital Stay: Payer: BC Managed Care – PPO | Attending: Hematology & Oncology | Admitting: Hematology & Oncology

## 2019-08-18 ENCOUNTER — Encounter: Payer: Self-pay | Admitting: Hematology & Oncology

## 2019-08-18 ENCOUNTER — Other Ambulatory Visit: Payer: Self-pay

## 2019-08-18 DIAGNOSIS — C9211 Chronic myeloid leukemia, BCR/ABL-positive, in remission: Secondary | ICD-10-CM | POA: Diagnosis present

## 2019-08-18 DIAGNOSIS — C9210 Chronic myeloid leukemia, BCR/ABL-positive, not having achieved remission: Secondary | ICD-10-CM

## 2019-08-18 DIAGNOSIS — B009 Herpesviral infection, unspecified: Secondary | ICD-10-CM | POA: Diagnosis not present

## 2019-08-18 DIAGNOSIS — L309 Dermatitis, unspecified: Secondary | ICD-10-CM | POA: Diagnosis not present

## 2019-08-18 LAB — CMP (CANCER CENTER ONLY)
ALT: 21 U/L (ref 0–44)
AST: 21 U/L (ref 15–41)
Albumin: 4.7 g/dL (ref 3.5–5.0)
Alkaline Phosphatase: 40 U/L (ref 38–126)
Anion gap: 8 (ref 5–15)
BUN: 23 mg/dL — ABNORMAL HIGH (ref 6–20)
CO2: 31 mmol/L (ref 22–32)
Calcium: 10.4 mg/dL — ABNORMAL HIGH (ref 8.9–10.3)
Chloride: 103 mmol/L (ref 98–111)
Creatinine: 1.08 mg/dL — ABNORMAL HIGH (ref 0.44–1.00)
GFR, Est AFR Am: >60 mL/min (ref >60)
GFR, Estimated: 59 mL/min — ABNORMAL LOW (ref >60)
Glucose, Bld: 124 mg/dL — ABNORMAL HIGH (ref 70–99)
Potassium: 5.4 mmol/L — ABNORMAL HIGH (ref 3.5–5.1)
Sodium: 142 mmol/L (ref 135–145)
Total Bilirubin: 0.3 mg/dL (ref 0.3–1.2)
Total Protein: 7.5 g/dL (ref 6.5–8.1)

## 2019-08-18 LAB — CBC WITH DIFFERENTIAL (CANCER CENTER ONLY)
Abs Immature Granulocytes: 0.02 10*3/uL (ref 0.00–0.07)
Basophils Absolute: 0.1 10*3/uL (ref 0.0–0.1)
Basophils Relative: 1 %
Eosinophils Absolute: 0.1 10*3/uL (ref 0.0–0.5)
Eosinophils Relative: 1 %
HCT: 43 % (ref 36.0–46.0)
Hemoglobin: 13.8 g/dL (ref 12.0–15.0)
Immature Granulocytes: 0 %
Lymphocytes Relative: 26 %
Lymphs Abs: 1.5 10*3/uL (ref 0.7–4.0)
MCH: 30.7 pg (ref 26.0–34.0)
MCHC: 32.1 g/dL (ref 30.0–36.0)
MCV: 95.6 fL (ref 80.0–100.0)
Monocytes Absolute: 0.4 10*3/uL (ref 0.1–1.0)
Monocytes Relative: 6 %
Neutro Abs: 3.7 10*3/uL (ref 1.7–7.7)
Neutrophils Relative %: 66 %
Platelet Count: 220 10*3/uL (ref 150–400)
RBC: 4.5 MIL/uL (ref 3.87–5.11)
RDW: 12.7 % (ref 11.5–15.5)
WBC Count: 5.7 10*3/uL (ref 4.0–10.5)
nRBC: 0 % (ref 0.0–0.2)

## 2019-08-18 LAB — SAVE SMEAR(SSMR), FOR PROVIDER SLIDE REVIEW

## 2019-08-18 LAB — LACTATE DEHYDROGENASE: LDH: 162 U/L (ref 98–192)

## 2019-08-18 MED ORDER — TRIAMCINOLONE 0.1 % CREAM:EUCERIN CREAM 1:1: 1.0000 "application " | TOPICAL_CREAM | Freq: Every day | CUTANEOUS | 6 refills | Status: AC | PRN

## 2019-08-18 MED ORDER — VALACYCLOVIR HCL 500 MG PO TABS: 500.0000 mg | ORAL_TABLET | ORAL | 3 refills | Status: AC | PRN

## 2019-08-18 NOTE — Telephone Encounter (Signed)
Appointments scheduled patient declined calendar due to My Chart Access per 10/13 los

## 2019-08-18 NOTE — Progress Notes (Signed)
Hematology and Oncology Follow Up Visit  Christina Kelly [id number] 1966-04-26 53 y.o. 08/18/2019   Principle Diagnosis:   Chronic phase CML-major molecular remission  Current Therapy:    Observation.  She has been off of Sprycel since August 2016     Interim History:  [name] is back for followup.  She is doing quite well.  She has fully recovered from the episode of rhabdomyolysis that happened to her earlier this year.  She is basically at home.  She is trying to help her son with his school.  He is doing a Paramedic.  It is now been 4-1/2 years that she has been off the Sprycel.  Her BCR/ABL analysis back when we saw her in February was nondetected.  She has had no problems with cough or shortness of breath.  She and her son did go to the beach last week.  She did get little bit of a sunburn.  She has had a good appetite.  There is no change in bowel or bladder habits.  There is been no fever.  Overall, her performance status is ECOG 0.    Medications:  Current Outpatient Medications:    ALPRAZolam (XANAX) 0.25 MG tablet, Take 0.125 mg by mouth at bedtime as needed for sleep., Disp: , Rfl:    ciclopirox (PENLAC) 8 % solution, , Disp: , Rfl:    estradiol (ESTRACE) 2 MG tablet, Take 2 mg by mouth daily., Disp: , Rfl:    ibuprofen (ADVIL,MOTRIN) 200 MG tablet, Take 200 mg by mouth as needed for headache or moderate pain. , Disp: , Rfl:    Melatonin 3 MG TABS, Take 3 mg by mouth as needed., Disp: , Rfl:    Multiple Vitamin (MULTIVITAMIN WITH MINERALS) TABS, Take 1 tablet by mouth daily., Disp: , Rfl:    Triamcinolone Acetonide (TRIAMCINOLONE 0.1 % CREAM : EUCERIN) CREA, Apply 1 application topically daily as needed. For eczema, Disp: 1 each, Rfl: 6   valACYclovir (VALTREX) 500 MG tablet, Take 1 tablet (500 mg total) by mouth as needed (Herpes Virus). (Patient taking differently: Take 500 mg by mouth as needed (Herpes Virus). Take 4 tablets then another 4  tablets in 12 hours.), Disp: 90 tablet, Rfl: 3  Allergies:  Allergies  Allergen Reactions   Ciprofloxacin Rash   Levofloxacin Rash    Past Medical History, Surgical history, Social history, and Family History were reviewed and updated.  Review of Systems: Review of Systems  Constitutional: Negative.   HENT: Negative.   Eyes: Negative.   Respiratory: Negative.   Cardiovascular: Negative.   Gastrointestinal: Negative.   Genitourinary: Negative.   Musculoskeletal: Negative.   Skin: Negative.   Neurological: Negative.   Endo/Heme/Allergies: Negative.   Psychiatric/Behavioral: Negative.      Physical Exam:  height is 5' 11.5" (1.816 m) and weight is 177 lb 6.4 oz (80.5 kg). Her temporal temperature is 97.5 F (36.4 C) (abnormal). Her blood pressure is 129/82 and her pulse is 52 (abnormal). Her respiration is 18 and oxygen saturation is 100%.   Physical Exam Vitals signs reviewed.  HENT:     Head: Normocephalic and atraumatic.  Eyes:     Pupils: Pupils are equal, round, and reactive to light.  Neck:     Musculoskeletal: Normal range of motion.  Cardiovascular:     Rate and Rhythm: Normal rate and regular rhythm.     Heart sounds: Normal heart sounds.  Pulmonary:     Effort: Pulmonary effort is  normal.     Breath sounds: Normal breath sounds.  Abdominal:     General: Bowel sounds are normal.     Palpations: Abdomen is soft.  Musculoskeletal: Normal range of motion.        General: No tenderness or deformity.  Lymphadenopathy:     Cervical: No cervical adenopathy.  Skin:    General: Skin is warm and dry.     Findings: No erythema or rash.  Neurological:     Mental Status: She is alert and oriented to person, place, and time.  Psychiatric:        Behavior: Behavior normal.        Thought Content: Thought content normal.        Judgment: Judgment normal.     Lab Results  Component Value Date   WBC 5.7 08/18/2019   HGB 13.8 08/18/2019   HCT 43.0 08/18/2019     MCV 95.6 08/18/2019   PLT 220 08/18/2019     Chemistry      Component Value Date/Time   NA 142 08/18/2019 1025   NA 139 06/10/2017 0917   NA 140 10/11/2015 0856   K 5.4 (H) 08/18/2019 1025   K 3.9 06/10/2017 0917   K 4.5 10/11/2015 0856   CL 103 08/18/2019 1025   CL 106 06/10/2017 0917   CO2 31 08/18/2019 1025   CO2 28 06/10/2017 0917   CO2 25 10/11/2015 0856   BUN 23 (H) 08/18/2019 1025   BUN 16 06/10/2017 0917   BUN 15.5 10/11/2015 0856   CREATININE 1.08 (H) 08/18/2019 1025   CREATININE 1.2 06/10/2017 0917   CREATININE 1.0 10/11/2015 0856      Component Value Date/Time   CALCIUM 10.4 (H) 08/18/2019 1025   CALCIUM 9.3 06/10/2017 0917   CALCIUM 9.6 10/11/2015 0856   ALKPHOS 40 08/18/2019 1025   ALKPHOS 48 06/10/2017 0917   ALKPHOS 74 10/11/2015 0856   AST 21 08/18/2019 1025   AST 19 10/11/2015 0856   ALT 21 08/18/2019 1025   ALT 14 06/10/2017 0917   ALT 15 10/11/2015 0856   BILITOT 0.3 08/18/2019 1025   BILITOT 0.38 10/11/2015 0856       Impression and Plan: Christina Kelly is a 53 year old female with chronic phase CML. She was diagnosed back in May of 2014.. She's been a major molecular remission.  We stopped her Sprycel in August 2016. She is doing well off Sprycel.   We will see with the BCR/ABL analysis is.  I looked at her blood under the microscope.  I do not see anything that looks like CML relapse.  I think we get her back in 1 year now.  I think we could go for 1 year.  Is been such a while since she has been off the Sprycel.  Hopefully, she will maintain a major molecular remission for the long duration.  I am just happy that she is doing well with her family in [place].  She is thinking about transferring her care to a physician in [place].  I have no problems with this if she would like to do this.  [name], MD 10/13/202011:52 AM

## 2019-08-31 LAB — BCR/ABL

## 2019-09-01 ENCOUNTER — Telehealth: Payer: Self-pay | Admitting: *Deleted

## 2019-09-01 NOTE — Telephone Encounter (Signed)
Notified pt of results. Pt verbalized understanding. No concerns at this time. 

## 2019-09-01 NOTE — Telephone Encounter (Signed)
-----   Message from [name], MD sent at 09/01/2019  6:26 AM EDT ----- Call - the CML is still in remission!!!  Enjoy the [facility]!!!  Laurey Arrow

## 2020-07-19 ENCOUNTER — Other Ambulatory Visit: Payer: Self-pay | Admitting: *Deleted

## 2020-07-19 ENCOUNTER — Telehealth: Payer: Self-pay | Admitting: *Deleted

## 2020-07-19 DIAGNOSIS — C9210 Chronic myeloid leukemia, BCR/ABL-positive, not having achieved remission: Secondary | ICD-10-CM

## 2020-07-19 NOTE — Telephone Encounter (Signed)
Message received from patient requesting a referral to Dr. Lysle Dingwall at [facility] d/t pt has recently moved. Call placed back to patient to notify her that referral has been placed per her request.  Pt requests that appts scheduled for October be canceled.  Appts canceled per pt.'s request.

## 2020-07-21 ENCOUNTER — Telehealth: Payer: Self-pay | Admitting: *Deleted

## 2020-07-21 NOTE — Telephone Encounter (Signed)
Call received from [place] at [facility] requesting pathology reports and BCR-ABL reports faxed to [phone].  Reports faxed per Cassandra's request.

## 2020-08-17 ENCOUNTER — Other Ambulatory Visit: Payer: 59

## 2020-08-17 ENCOUNTER — Ambulatory Visit: Payer: 59 | Admitting: Hematology & Oncology
